# Patient Record
Sex: Female | Born: 1970 | Race: White | Hispanic: No | Marital: Married | Smoking: Never smoker
Health system: Southern US, Community
[De-identification: ages and names within clinical notes are randomized; demographics above are authoritative.]

## PROBLEM LIST (undated history)

## (undated) DIAGNOSIS — O09529 Supervision of elderly multigravida, unspecified trimester: Secondary | ICD-10-CM

## (undated) DIAGNOSIS — E039 Hypothyroidism, unspecified: Secondary | ICD-10-CM

## (undated) DIAGNOSIS — Z8619 Personal history of other infectious and parasitic diseases: Secondary | ICD-10-CM

## (undated) HISTORY — PX: WISDOM TOOTH EXTRACTION: SHX21

## (undated) HISTORY — DX: Supervision of elderly multigravida, unspecified trimester: O09.529

## (undated) HISTORY — PX: DILATION AND CURETTAGE OF UTERUS: SHX78

## (undated) HISTORY — PX: OTHER SURGICAL HISTORY: SHX169

## (undated) HISTORY — DX: Personal history of other infectious and parasitic diseases: Z86.19

## (undated) HISTORY — DX: Hypothyroidism, unspecified: E03.9

---

## 1994-07-22 HISTORY — PX: BUNIONECTOMY: SHX129

## 2009-04-03 ENCOUNTER — Encounter (HOSPITAL_COMMUNITY): Payer: Self-pay | Admitting: Obstetrics and Gynecology

## 2009-04-03 ENCOUNTER — Ambulatory Visit (HOSPITAL_COMMUNITY): Admission: RE | Admit: 2009-04-03 | Discharge: 2009-04-03 | Payer: Self-pay | Admitting: Obstetrics and Gynecology

## 2010-03-19 ENCOUNTER — Inpatient Hospital Stay (HOSPITAL_COMMUNITY): Admission: AD | Admit: 2010-03-19 | Discharge: 2010-03-22 | Payer: Self-pay | Admitting: Obstetrics and Gynecology

## 2010-10-05 LAB — CBC
HCT: 30.5 % — ABNORMAL LOW (ref 36.0–46.0)
HCT: 37.6 % (ref 36.0–46.0)
Hemoglobin: 10.5 g/dL — ABNORMAL LOW (ref 12.0–15.0)
Hemoglobin: 12.5 g/dL (ref 12.0–15.0)
MCH: 33.8 pg (ref 26.0–34.0)
MCH: 35.4 pg — ABNORMAL HIGH (ref 26.0–34.0)
MCHC: 33.3 g/dL (ref 30.0–36.0)
MCHC: 34.4 g/dL (ref 30.0–36.0)
MCV: 101.4 fL — ABNORMAL HIGH (ref 78.0–100.0)
MCV: 102.8 fL — ABNORMAL HIGH (ref 78.0–100.0)
Platelets: 138 10*3/uL — ABNORMAL LOW (ref 150–400)
Platelets: 181 10*3/uL (ref 150–400)
RBC: 2.97 MIL/uL — ABNORMAL LOW (ref 3.87–5.11)
RBC: 3.71 MIL/uL — ABNORMAL LOW (ref 3.87–5.11)
RDW: 14.2 % (ref 11.5–15.5)
RDW: 14.6 % (ref 11.5–15.5)
WBC: 15.7 10*3/uL — ABNORMAL HIGH (ref 4.0–10.5)
WBC: 16.9 10*3/uL — ABNORMAL HIGH (ref 4.0–10.5)

## 2010-10-05 LAB — RPR: RPR Ser Ql: NONREACTIVE

## 2010-10-26 LAB — CBC
HCT: 37.7 % (ref 36.0–46.0)
Hemoglobin: 12.7 g/dL (ref 12.0–15.0)
MCHC: 33.7 g/dL (ref 30.0–36.0)
MCV: 96.4 fL (ref 78.0–100.0)
Platelets: 234 10*3/uL (ref 150–400)
RBC: 3.91 MIL/uL (ref 3.87–5.11)
RDW: 13 % (ref 11.5–15.5)
WBC: 8 10*3/uL (ref 4.0–10.5)

## 2012-05-19 LAB — OB RESULTS CONSOLE ANTIBODY SCREEN: Antibody Screen: NEGATIVE

## 2012-05-19 LAB — OB RESULTS CONSOLE HIV ANTIBODY (ROUTINE TESTING): HIV: NONREACTIVE

## 2012-05-19 LAB — OB RESULTS CONSOLE RUBELLA ANTIBODY, IGM: Rubella: IMMUNE

## 2012-05-19 LAB — OB RESULTS CONSOLE ABO/RH: RH Type: POSITIVE

## 2012-05-19 LAB — OB RESULTS CONSOLE GC/CHLAMYDIA
Chlamydia: NEGATIVE
Gonorrhea: NEGATIVE

## 2012-05-19 LAB — OB RESULTS CONSOLE HEPATITIS B SURFACE ANTIGEN: Hepatitis B Surface Ag: NEGATIVE

## 2012-05-19 LAB — OB RESULTS CONSOLE RPR: RPR: NONREACTIVE

## 2012-12-21 ENCOUNTER — Inpatient Hospital Stay (HOSPITAL_COMMUNITY): Admission: AD | Admit: 2012-12-21 | Payer: Self-pay | Source: Ambulatory Visit | Admitting: Obstetrics and Gynecology

## 2012-12-22 ENCOUNTER — Telehealth (HOSPITAL_COMMUNITY): Payer: Self-pay | Admitting: *Deleted

## 2012-12-22 ENCOUNTER — Encounter (HOSPITAL_COMMUNITY): Payer: Self-pay | Admitting: *Deleted

## 2012-12-22 LAB — OB RESULTS CONSOLE GBS: GBS: NEGATIVE

## 2012-12-22 NOTE — Telephone Encounter (Signed)
Preadmission screen  

## 2012-12-24 ENCOUNTER — Encounter (HOSPITAL_COMMUNITY): Payer: Self-pay

## 2012-12-24 ENCOUNTER — Inpatient Hospital Stay (HOSPITAL_COMMUNITY)
Admission: RE | Admit: 2012-12-24 | Discharge: 2012-12-28 | DRG: 765 | Disposition: A | Discharge status: Home / Self Care | Payer: 59 | Source: Ambulatory Visit | Attending: Obstetrics & Gynecology | Admitting: Obstetrics & Gynecology

## 2012-12-24 DIAGNOSIS — O411090 Infection of amniotic sac and membranes, unspecified, unspecified trimester, not applicable or unspecified: Secondary | ICD-10-CM | POA: Diagnosis present

## 2012-12-24 DIAGNOSIS — E039 Hypothyroidism, unspecified: Secondary | ICD-10-CM | POA: Diagnosis present

## 2012-12-24 DIAGNOSIS — O480 Post-term pregnancy: Principal | ICD-10-CM | POA: Diagnosis present

## 2012-12-24 DIAGNOSIS — E079 Disorder of thyroid, unspecified: Secondary | ICD-10-CM | POA: Diagnosis present

## 2012-12-24 DIAGNOSIS — O09529 Supervision of elderly multigravida, unspecified trimester: Secondary | ICD-10-CM | POA: Diagnosis present

## 2012-12-24 DIAGNOSIS — O324XX0 Maternal care for high head at term, not applicable or unspecified: Secondary | ICD-10-CM | POA: Diagnosis present

## 2012-12-24 LAB — CBC
HCT: 33.3 % — ABNORMAL LOW (ref 36.0–46.0)
Hemoglobin: 11.3 g/dL — ABNORMAL LOW (ref 12.0–15.0)
MCH: 32 pg (ref 26.0–34.0)
MCHC: 33.9 g/dL (ref 30.0–36.0)
MCV: 94.3 fL (ref 78.0–100.0)
Platelets: 191 10*3/uL (ref 150–400)
RBC: 3.53 MIL/uL — ABNORMAL LOW (ref 3.87–5.11)
RDW: 14 % (ref 11.5–15.5)
WBC: 9.5 10*3/uL (ref 4.0–10.5)

## 2012-12-24 MED ORDER — LIDOCAINE HCL (PF) 1 % IJ SOLN
30.0000 mL | INTRAMUSCULAR | Status: DC | PRN
  Filled 2012-12-24: qty 30

## 2012-12-24 MED ORDER — ACETAMINOPHEN 325 MG PO TABS
650.0000 mg | ORAL_TABLET | ORAL | Status: DC | PRN
  Administered 2012-12-25: 650 mg via ORAL
  Filled 2012-12-24: qty 2

## 2012-12-24 MED ORDER — ZOLPIDEM TARTRATE 5 MG PO TABS
5.0000 mg | ORAL_TABLET | Freq: Every evening | ORAL | Status: DC | PRN
  Administered 2012-12-24: 5 mg via ORAL
  Filled 2012-12-24: qty 1

## 2012-12-24 MED ORDER — OXYCODONE-ACETAMINOPHEN 5-325 MG PO TABS: 1.0000 | ORAL_TABLET | ORAL | Status: DC | PRN

## 2012-12-24 MED ORDER — PROMETHAZINE HCL 25 MG/ML IJ SOLN: 25.0000 mg | Freq: Four times a day (QID) | INTRAMUSCULAR | Status: DC | PRN

## 2012-12-24 MED ORDER — OXYTOCIN BOLUS FROM INFUSION: 500.0000 mL | INTRAVENOUS | Status: DC

## 2012-12-24 MED ORDER — CITRIC ACID-SODIUM CITRATE 334-500 MG/5ML PO SOLN
30.0000 mL | ORAL | Status: DC | PRN
  Administered 2012-12-25: 30 mL via ORAL
  Filled 2012-12-24: qty 15

## 2012-12-24 MED ORDER — OXYTOCIN 40 UNITS IN LACTATED RINGERS INFUSION - SIMPLE MED: 62.5000 mL/h | INTRAVENOUS | Status: DC

## 2012-12-24 MED ORDER — BUTORPHANOL TARTRATE 1 MG/ML IJ SOLN: 1.0000 mg | INTRAMUSCULAR | Status: DC | PRN

## 2012-12-24 MED ORDER — TERBUTALINE SULFATE 1 MG/ML IJ SOLN
0.2500 mg | Freq: Once | INTRAMUSCULAR | Status: AC | PRN
  Filled 2012-12-24: qty 1

## 2012-12-24 MED ORDER — ONDANSETRON HCL 4 MG/2ML IJ SOLN: 4.0000 mg | Freq: Four times a day (QID) | INTRAMUSCULAR | Status: DC | PRN

## 2012-12-24 MED ORDER — FLEET ENEMA 7-19 GM/118ML RE ENEM: 1.0000 | ENEMA | RECTAL | Status: DC | PRN

## 2012-12-24 MED ORDER — LACTATED RINGERS IV SOLN
INTRAVENOUS | Status: DC
  Administered 2012-12-24 – 2012-12-25 (×3): via INTRAVENOUS

## 2012-12-24 MED ORDER — IBUPROFEN 600 MG PO TABS: 600.0000 mg | ORAL_TABLET | Freq: Four times a day (QID) | ORAL | Status: DC | PRN

## 2012-12-24 MED ORDER — LACTATED RINGERS IV SOLN
500.0000 mL | INTRAVENOUS | Status: DC | PRN
  Administered 2012-12-25 (×3): 300 mL via INTRAVENOUS

## 2012-12-24 MED ORDER — MISOPROSTOL 25 MCG QUARTER TABLET
25.0000 ug | ORAL_TABLET | ORAL | Status: DC | PRN
  Administered 2012-12-24 – 2012-12-25 (×2): 25 ug via VAGINAL
  Filled 2012-12-24 (×2): qty 0.25

## 2012-12-25 ENCOUNTER — Encounter (HOSPITAL_COMMUNITY)
Admission: RE | Disposition: A | Discharge status: Home / Self Care | Payer: Self-pay | Source: Ambulatory Visit | Attending: Obstetrics & Gynecology

## 2012-12-25 ENCOUNTER — Encounter (HOSPITAL_COMMUNITY): Payer: Self-pay

## 2012-12-25 ENCOUNTER — Inpatient Hospital Stay (HOSPITAL_COMMUNITY): Payer: 59 | Admitting: Anesthesiology

## 2012-12-25 ENCOUNTER — Encounter (HOSPITAL_COMMUNITY): Payer: Self-pay | Admitting: Anesthesiology

## 2012-12-25 LAB — RPR: RPR Ser Ql: NONREACTIVE

## 2012-12-25 SURGERY — Surgical Case
Anesthesia: Epidural | Site: Abdomen | Wound class: Clean Contaminated

## 2012-12-25 MED ORDER — MORPHINE SULFATE (PF) 0.5 MG/ML IJ SOLN
INTRAMUSCULAR | Status: DC | PRN
  Administered 2012-12-25: 4 mg via EPIDURAL

## 2012-12-25 MED ORDER — DIPHENHYDRAMINE HCL 25 MG PO CAPS: 25.0000 mg | ORAL_CAPSULE | Freq: Four times a day (QID) | ORAL | Status: DC | PRN

## 2012-12-25 MED ORDER — PHENYLEPHRINE 40 MCG/ML (10ML) SYRINGE FOR IV PUSH (FOR BLOOD PRESSURE SUPPORT)
80.0000 ug | PREFILLED_SYRINGE | INTRAVENOUS | Status: DC | PRN
  Administered 2012-12-25 (×2): 80 ug via INTRAVENOUS
  Filled 2012-12-25: qty 5

## 2012-12-25 MED ORDER — PRENATAL MULTIVITAMIN CH
1.0000 | ORAL_TABLET | Freq: Every day | ORAL | Status: DC
  Administered 2012-12-26 – 2012-12-28 (×3): 1 via ORAL
  Filled 2012-12-25 (×3): qty 1

## 2012-12-25 MED ORDER — MEDROXYPROGESTERONE ACETATE 150 MG/ML IM SUSP: 150.0000 mg | INTRAMUSCULAR | Status: DC | PRN

## 2012-12-25 MED ORDER — NALBUPHINE HCL 10 MG/ML IJ SOLN: 5.0000 mg | INTRAMUSCULAR | Status: DC | PRN

## 2012-12-25 MED ORDER — SODIUM CHLORIDE 0.9 % IV SOLN
2.0000 g | Freq: Once | INTRAVENOUS | Status: AC
  Administered 2012-12-25: 2 g via INTRAVENOUS
  Filled 2012-12-25: qty 2000

## 2012-12-25 MED ORDER — KETOROLAC TROMETHAMINE 30 MG/ML IJ SOLN: 30.0000 mg | Freq: Four times a day (QID) | INTRAMUSCULAR | Status: AC | PRN

## 2012-12-25 MED ORDER — MEPERIDINE HCL 25 MG/ML IJ SOLN
INTRAMUSCULAR | Status: DC | PRN
  Administered 2012-12-25 (×2): 12.5 mg via INTRAVENOUS

## 2012-12-25 MED ORDER — MORPHINE SULFATE 0.5 MG/ML IJ SOLN
INTRAMUSCULAR | Status: AC
  Filled 2012-12-25: qty 10

## 2012-12-25 MED ORDER — SODIUM CHLORIDE 0.9 % IR SOLN
Status: DC | PRN
  Administered 2012-12-25: 1000 mL

## 2012-12-25 MED ORDER — DIBUCAINE 1 % RE OINT: 1.0000 "application " | TOPICAL_OINTMENT | RECTAL | Status: DC | PRN

## 2012-12-25 MED ORDER — DIPHENHYDRAMINE HCL 50 MG/ML IJ SOLN: 25.0000 mg | INTRAMUSCULAR | Status: DC | PRN

## 2012-12-25 MED ORDER — LACTATED RINGERS IV SOLN
INTRAVENOUS | Status: DC | PRN
  Administered 2012-12-25: 19:00:00 via INTRAVENOUS

## 2012-12-25 MED ORDER — LIDOCAINE HCL (PF) 1 % IJ SOLN
INTRAMUSCULAR | Status: DC | PRN
  Administered 2012-12-25 (×2): 5 mL

## 2012-12-25 MED ORDER — TETANUS-DIPHTH-ACELL PERTUSSIS 5-2.5-18.5 LF-MCG/0.5 IM SUSP: 0.5000 mL | Freq: Once | INTRAMUSCULAR | Status: DC

## 2012-12-25 MED ORDER — FENTANYL CITRATE 0.05 MG/ML IJ SOLN
25.0000 ug | INTRAMUSCULAR | Status: DC | PRN
  Administered 2012-12-25: 50 ug via INTRAVENOUS

## 2012-12-25 MED ORDER — LACTATED RINGERS IV SOLN
INTRAVENOUS | Status: DC | PRN
  Administered 2012-12-25 (×2): via INTRAVENOUS

## 2012-12-25 MED ORDER — EPHEDRINE 5 MG/ML INJ
10.0000 mg | INTRAVENOUS | Status: DC | PRN
  Filled 2012-12-25: qty 4

## 2012-12-25 MED ORDER — SODIUM BICARBONATE 8.4 % IV SOLN: INTRAVENOUS | Status: DC | PRN

## 2012-12-25 MED ORDER — ONDANSETRON HCL 4 MG PO TABS: 4.0000 mg | ORAL_TABLET | ORAL | Status: DC | PRN

## 2012-12-25 MED ORDER — GENTAMICIN SULFATE 40 MG/ML IJ SOLN
150.0000 mg | Freq: Three times a day (TID) | INTRAVENOUS | Status: DC
  Administered 2012-12-25: 150 mg via INTRAVENOUS
  Filled 2012-12-25 (×2): qty 3.75

## 2012-12-25 MED ORDER — LIDOCAINE-EPINEPHRINE (PF) 2 %-1:200000 IJ SOLN
INTRAMUSCULAR | Status: DC | PRN
  Administered 2012-12-25 (×4): 5 mg via INTRADERMAL

## 2012-12-25 MED ORDER — MEPERIDINE HCL 25 MG/ML IJ SOLN
INTRAMUSCULAR | Status: AC
  Filled 2012-12-25: qty 1

## 2012-12-25 MED ORDER — KETOROLAC TROMETHAMINE 60 MG/2ML IM SOLN
60.0000 mg | Freq: Once | INTRAMUSCULAR | Status: AC | PRN
  Administered 2012-12-25: 60 mg via INTRAMUSCULAR

## 2012-12-25 MED ORDER — METOCLOPRAMIDE HCL 5 MG/ML IJ SOLN: 10.0000 mg | Freq: Three times a day (TID) | INTRAMUSCULAR | Status: DC | PRN

## 2012-12-25 MED ORDER — ONDANSETRON HCL 4 MG/2ML IJ SOLN: 4.0000 mg | INTRAMUSCULAR | Status: DC | PRN

## 2012-12-25 MED ORDER — SCOPOLAMINE 1 MG/3DAYS TD PT72
MEDICATED_PATCH | TRANSDERMAL | Status: AC
  Filled 2012-12-25: qty 1

## 2012-12-25 MED ORDER — SIMETHICONE 80 MG PO CHEW
80.0000 mg | CHEWABLE_TABLET | Freq: Three times a day (TID) | ORAL | Status: DC
  Administered 2012-12-26 – 2012-12-28 (×9): 80 mg via ORAL

## 2012-12-25 MED ORDER — FENTANYL 2.5 MCG/ML BUPIVACAINE 1/10 % EPIDURAL INFUSION (WH - ANES)
14.0000 mL/h | INTRAMUSCULAR | Status: DC | PRN
  Administered 2012-12-25 (×2): 14 mL/h via EPIDURAL
  Filled 2012-12-25 (×2): qty 125

## 2012-12-25 MED ORDER — PHENYLEPHRINE 40 MCG/ML (10ML) SYRINGE FOR IV PUSH (FOR BLOOD PRESSURE SUPPORT): 80.0000 ug | PREFILLED_SYRINGE | INTRAVENOUS | Status: DC | PRN

## 2012-12-25 MED ORDER — ONDANSETRON HCL 4 MG/2ML IJ SOLN
INTRAMUSCULAR | Status: DC | PRN
  Administered 2012-12-25: 4 mg via INTRAVENOUS

## 2012-12-25 MED ORDER — OXYTOCIN 40 UNITS IN LACTATED RINGERS INFUSION - SIMPLE MED: 62.5000 mL/h | INTRAVENOUS | Status: AC

## 2012-12-25 MED ORDER — MORPHINE SULFATE (PF) 0.5 MG/ML IJ SOLN
INTRAMUSCULAR | Status: DC | PRN
  Administered 2012-12-25: 1 mg via INTRAVENOUS

## 2012-12-25 MED ORDER — SODIUM CHLORIDE 0.9 % IJ SOLN: 3.0000 mL | INTRAMUSCULAR | Status: DC | PRN

## 2012-12-25 MED ORDER — SENNOSIDES-DOCUSATE SODIUM 8.6-50 MG PO TABS
2.0000 | ORAL_TABLET | Freq: Every day | ORAL | Status: DC
  Administered 2012-12-26 – 2012-12-27 (×2): 2 via ORAL

## 2012-12-25 MED ORDER — NALOXONE HCL 1 MG/ML IJ SOLN: 1.0000 ug/kg/h | INTRAVENOUS | Status: DC | PRN

## 2012-12-25 MED ORDER — MENTHOL 3 MG MT LOZG: 1.0000 | LOZENGE | OROMUCOSAL | Status: DC | PRN

## 2012-12-25 MED ORDER — DEXTROSE IN LACTATED RINGERS 5 % IV SOLN
INTRAVENOUS | Status: DC
  Administered 2012-12-26: 04:00:00 via INTRAVENOUS

## 2012-12-25 MED ORDER — MEPERIDINE HCL 25 MG/ML IJ SOLN: 6.2500 mg | INTRAMUSCULAR | Status: DC | PRN

## 2012-12-25 MED ORDER — DIPHENHYDRAMINE HCL 50 MG/ML IJ SOLN: 12.5000 mg | INTRAMUSCULAR | Status: DC | PRN

## 2012-12-25 MED ORDER — KETOROLAC TROMETHAMINE 60 MG/2ML IM SOLN
INTRAMUSCULAR | Status: AC
  Filled 2012-12-25: qty 2

## 2012-12-25 MED ORDER — ONDANSETRON HCL 4 MG/2ML IJ SOLN
4.0000 mg | Freq: Three times a day (TID) | INTRAMUSCULAR | Status: DC | PRN
Start: 2012-12-25 — End: 2012-12-28

## 2012-12-25 MED ORDER — LACTATED RINGERS IV SOLN
500.0000 mL | Freq: Once | INTRAVENOUS | Status: AC
  Administered 2012-12-25: 500 mL via INTRAVENOUS

## 2012-12-25 MED ORDER — SCOPOLAMINE 1 MG/3DAYS TD PT72
1.0000 | MEDICATED_PATCH | Freq: Once | TRANSDERMAL | Status: DC
  Administered 2012-12-25: 1.5 mg via TRANSDERMAL

## 2012-12-25 MED ORDER — MEASLES, MUMPS & RUBELLA VAC ~~LOC~~ INJ: 0.5000 mL | INJECTION | Freq: Once | SUBCUTANEOUS | Status: DC

## 2012-12-25 MED ORDER — IBUPROFEN 600 MG PO TABS
600.0000 mg | ORAL_TABLET | Freq: Four times a day (QID) | ORAL | Status: DC
  Administered 2012-12-26 – 2012-12-28 (×10): 600 mg via ORAL
  Filled 2012-12-25 (×10): qty 1

## 2012-12-25 MED ORDER — OXYCODONE-ACETAMINOPHEN 5-325 MG PO TABS
1.0000 | ORAL_TABLET | ORAL | Status: DC | PRN
  Administered 2012-12-26 – 2012-12-28 (×8): 1 via ORAL
  Filled 2012-12-25 (×8): qty 1

## 2012-12-25 MED ORDER — OXYTOCIN 40 UNITS IN LACTATED RINGERS INFUSION - SIMPLE MED
1.0000 m[IU]/min | INTRAVENOUS | Status: DC
  Administered 2012-12-25: 2 m[IU]/min via INTRAVENOUS
  Filled 2012-12-25: qty 1000

## 2012-12-25 MED ORDER — WITCH HAZEL-GLYCERIN EX PADS: 1.0000 "application " | MEDICATED_PAD | CUTANEOUS | Status: DC | PRN

## 2012-12-25 MED ORDER — SIMETHICONE 80 MG PO CHEW
80.0000 mg | CHEWABLE_TABLET | ORAL | Status: DC | PRN
  Administered 2012-12-26: 80 mg via ORAL

## 2012-12-25 MED ORDER — TERBUTALINE SULFATE 1 MG/ML IJ SOLN: 0.2500 mg | Freq: Once | INTRAMUSCULAR | Status: DC | PRN

## 2012-12-25 MED ORDER — FENTANYL CITRATE 0.05 MG/ML IJ SOLN
INTRAMUSCULAR | Status: AC
  Filled 2012-12-25: qty 2

## 2012-12-25 MED ORDER — LANOLIN HYDROUS EX OINT: 1.0000 "application " | TOPICAL_OINTMENT | CUTANEOUS | Status: DC | PRN

## 2012-12-25 MED ORDER — NALOXONE HCL 0.4 MG/ML IJ SOLN: 0.4000 mg | INTRAMUSCULAR | Status: DC | PRN

## 2012-12-25 MED ORDER — LEVOTHYROXINE SODIUM 88 MCG PO TABS
88.0000 ug | ORAL_TABLET | Freq: Every day | ORAL | Status: DC
  Administered 2012-12-25: 88 ug via ORAL
  Filled 2012-12-25 (×2): qty 1

## 2012-12-25 MED ORDER — DIPHENHYDRAMINE HCL 25 MG PO CAPS: 25.0000 mg | ORAL_CAPSULE | ORAL | Status: DC | PRN

## 2012-12-25 MED ORDER — EPHEDRINE 5 MG/ML INJ: 10.0000 mg | INTRAVENOUS | Status: DC | PRN

## 2012-12-25 SURGICAL SUPPLY — 30 items
CLAMP CORD UMBIL (MISCELLANEOUS) IMPLANT
CLOTH BEACON ORANGE TIMEOUT ST (SAFETY) ×2 IMPLANT
DERMABOND ADVANCED (GAUZE/BANDAGES/DRESSINGS) ×1
DERMABOND ADVANCED .7 DNX12 (GAUZE/BANDAGES/DRESSINGS) ×1 IMPLANT
DRAPE LG THREE QUARTER DISP (DRAPES) ×2 IMPLANT
DRSG OPSITE POSTOP 4X10 (GAUZE/BANDAGES/DRESSINGS) ×2 IMPLANT
DURAPREP 26ML APPLICATOR (WOUND CARE) ×2 IMPLANT
ELECT REM PT RETURN 9FT ADLT (ELECTROSURGICAL) ×2
ELECTRODE REM PT RTRN 9FT ADLT (ELECTROSURGICAL) ×1 IMPLANT
EXTRACTOR VACUUM M CUP 4 TUBE (SUCTIONS) IMPLANT
GLOVE BIO SURGEON STRL SZ 6.5 (GLOVE) ×2 IMPLANT
GLOVE BIOGEL PI IND STRL 7.0 (GLOVE) ×1 IMPLANT
GLOVE BIOGEL PI INDICATOR 7.0 (GLOVE) ×1
GOWN STRL REIN XL XLG (GOWN DISPOSABLE) ×4 IMPLANT
KIT ABG SYR 3ML LUER SLIP (SYRINGE) ×2 IMPLANT
NEEDLE HYPO 25X5/8 SAFETYGLIDE (NEEDLE) ×2 IMPLANT
NS IRRIG 1000ML POUR BTL (IV SOLUTION) ×2 IMPLANT
PACK C SECTION WH (CUSTOM PROCEDURE TRAY) ×2 IMPLANT
PAD OB MATERNITY 4.3X12.25 (PERSONAL CARE ITEMS) ×2 IMPLANT
SPONGE LAP 18X18 X RAY DECT (DISPOSABLE) ×2 IMPLANT
STAPLER VISISTAT 35W (STAPLE) IMPLANT
SUT CHROMIC 0 CT 802H (SUTURE) IMPLANT
SUT CHROMIC 0 CTX 36 (SUTURE) ×4 IMPLANT
SUT MON AB-0 CT1 36 (SUTURE) ×2 IMPLANT
SUT PDS AB 0 CTX 60 (SUTURE) ×2 IMPLANT
SUT PLAIN 0 NONE (SUTURE) IMPLANT
SUT VIC AB 4-0 KS 27 (SUTURE) ×2 IMPLANT
TOWEL OR 17X24 6PK STRL BLUE (TOWEL DISPOSABLE) ×6 IMPLANT
TRAY FOLEY CATH 14FR (SET/KITS/TRAYS/PACK) IMPLANT
WATER STERILE IRR 1000ML POUR (IV SOLUTION) ×2 IMPLANT

## 2012-12-25 NOTE — Progress Notes (Signed)
Pt comfortable w/ epidural  FHT 130-140s with decreased variability Toco Q2 cvx 9.5/c/+1, + scalp stim.  Feels warm Temp 101  A/P:  chorio - start amp/gent Recheck or prn

## 2012-12-25 NOTE — Anesthesia Procedure Notes (Signed)
Epidural Patient location during procedure: OB Start time: 12/25/2012 9:33 AM  Staffing Anesthesiologist: Angus Seller., Harrell Gave. Performed by: anesthesiologist   Preanesthetic Checklist Completed: patient identified, site marked, surgical consent, pre-op evaluation, timeout performed, IV checked, risks and benefits discussed and monitors and equipment checked  Epidural Patient position: sitting Prep: site prepped and draped and DuraPrep Patient monitoring: continuous pulse ox and blood pressure Approach: midline Injection technique: LOR air and LOR saline  Needle:  Needle type: Tuohy  Needle gauge: 17 G Needle length: 9 cm and 9 Needle insertion depth: 5 cm cm Catheter type: closed end flexible Catheter size: 19 Gauge Catheter at skin depth: 10 cm Test dose: negative  Assessment Events: blood not aspirated, injection not painful, no injection resistance, negative IV test and no paresthesia  Additional Notes Patient identified.  Risk benefits discussed including failed block, incomplete pain control, headache, nerve damage, paralysis, blood pressure changes, nausea, vomiting, reactions to medication both toxic or allergic, and postpartum back pain.  Patient expressed understanding and wished to proceed.  All questions were answered.  Sterile technique used throughout procedure and epidural site dressed with sterile barrier dressing. No paresthesia or other complications noted.The patient did not experience any signs of intravascular injection such as tinnitus or metallic taste in mouth nor signs of intrathecal spread such as rapid motor block. Please see nursing notes for vital signs.

## 2012-12-25 NOTE — Progress Notes (Signed)
ANTIBIOTIC CONSULT NOTE - INITIAL  Pharmacy Consult for Gentamicin Indication: Maternal temp/ R/O chorioamnionitis  No Known Allergies  Patient Measurements: Height: 5\' 3"  (160 cm) Weight: 176 lb (79.833 kg) IBW/kg (Calculated) : 52.4 Adjusted Body Weight: 60.6kg  Vital Signs: Temp: 101.4 F (38.6 C) (06/06 1813) Temp src: Axillary (06/06 1813) BP: 130/75 mmHg (06/06 1831) Pulse Rate: 63 (06/06 1831)  Labs:  Recent Labs  12/24/12 1808  WBC 9.5  HGB 11.3*  PLT 191     Microbiology: Recent Results (from the past 720 hour(s))  OB RESULTS CONSOLE GBS     Status: None   Collection Time    12/22/12 12:00 AM      Result Value Range Status   GBS Negative   Final    Medical History: Past Medical History  Diagnosis Date  . Hx of varicella   . Hypothyroidism   . AMA (advanced maternal age) multigravida 35+     Medications: Ampicillin 2 gram IV q6h Assessment: 42yo F  Admitted for IOL postdate. Pt has developed maternal temp in labor. Antibiotics initiated to r/o chorioamnionitis.  Goal of Therapy:  Gentamicin peaks 6-55mcg/ml and trough < 26mcg/ml  Plan:  1. Gentamicin 150mg  IV q8h. 2. Will check SCr if Gentamicin continued > 72 hours. 3. Will continue to follow and assess need for levels based on duration and clinical status of pt.  Claybon Jabs 12/25/2012,8:11 PM

## 2012-12-25 NOTE — H&P (Signed)
Deborah Bowman is a 42 y.o. female presenting for postdates IOL.  Pregnancy uncomplicated History OB History   Grav Para Term Preterm Abortions TAB SAB Ect Mult Living   4 1 1  2  2   1      Past Medical History  Diagnosis Date  . Hx of varicella   . Hypothyroidism   . AMA (advanced maternal age) multigravida 35+    Past Surgical History  Procedure Laterality Date  . Paraovarian cyst removal      and tube ? side  . Dilation and curettage of uterus    . Bunionectomy  1996  . Wisdom tooth extraction     Family History: family history includes Depression in her paternal grandmother; Heart disease in her maternal grandfather and maternal grandmother; Hypertension in her maternal grandmother; and Hypothyroidism in her father. Social History:  reports that she has never smoked. She has never used smokeless tobacco. She reports that she does not drink alcohol or use illicit drugs.   Prenatal Transfer Tool  Maternal Diabetes: No Genetic Screening: Normal Maternal Ultrasounds/Referrals: Normal Fetal Ultrasounds or other Referrals:  None Maternal Substance Abuse:  No Significant Maternal Medications:  Meds include: Syntroid Significant Maternal Lab Results:  None Other Comments:  None  ROS  Dilation: 1.5 Effacement (%): Thick Station: -3 Exam by:: Renaldo Fiddler, MD Blood pressure 108/56, pulse 57, temperature 98.6 F (37 C), temperature source Oral, resp. rate 18, height 5\' 3"  (1.6 m), weight 79.833 kg (176 lb), last menstrual period 03/16/2012. Exam Physical Exam  Prenatal labs: ABO, Rh: O/Positive/-- (10/29 0000) Antibody: Negative (10/29 0000) Rubella: Immune (10/29 0000) RPR: NON REACTIVE (06/05 1808)  HBsAg: Negative (10/29 0000)  HIV: Non-reactive (10/29 0000)  GBS: Negative (06/03 0000)   Assessment/Plan: Will start pitocin Epidural prn Continue synthroid  Deborah Bowman 12/25/2012, 7:56 AM

## 2012-12-25 NOTE — Op Note (Signed)
Cesarean Section Procedure Note   Deborah Bowman  12/24/2012 - 12/25/2012  Indications: arrest of descent   Pre-operative Diagnosis: Arrest of Dilatation.   Post-operative Diagnosis: Same   Surgeon: Surgeon(s) and Role:    * Zelphia Cairo, MD - Primary   Assistants: none  Anesthesia: epidural   Procedure Details:  The patient was seen in the Holding Room. The risks, benefits, complications, treatment options, and expected outcomes were discussed with the patient. The patient concurred with the proposed plan, giving informed consent. identified as Deborah Bowman and the procedure verified as C-Section Delivery. A Time Out was held and the above information confirmed.  After induction of anesthesia, the patient was draped and prepped in the usual sterile manner. A transverse was made and carried down through the subcutaneous tissue to the fascia. Fascial incision was made and extended transversely. The fascia was separated from the underlying rectus tissue superiorly and inferiorly. The peritoneum was identified and entered. Peritoneal incision was extended longitudinally. The utero-vesical peritoneal reflection was incised transversely and the bladder flap was bluntly freed from the lower uterine segment. A low transverse uterine incision was made. Delivered from cephalic presentation was a vigerous female with Apgar scores of 8 at one minute and 9 at five minutes. Cord ph was not sent the umbilical cord was clamped and cut cord blood was obtained for evaluation. The placenta was removed Intact and appeared normal. The uterine outline, tubes and ovaries appeared normal}. The uterine incision was closed with running locked sutures of 0chromic gut.   Hemostasis was observed. Lavage was carried out until clear.  Peritoneum was closed with 0 monocryl. The fascia was then reapproximated with running sutures of 0PDS.  The skin was closed with 4-0Vicryl.   Instrument, sponge, and needle counts were  correct prior the abdominal closure and were correct at the conclusion of the case.     Estimated Blood Loss: 700cc   Urine Output: clear  Specimens: @ORSPECIMEN @   Complications: no complications  Disposition: PACU - hemodynamically stable.   Maternal Condition: stable   Baby condition / location:  stable with mom  Attending Attestation: I was present and scrubbed for the entire procedure.   Signed: Surgeon(s): Zelphia Cairo, MD

## 2012-12-25 NOTE — Progress Notes (Signed)
Pt comfortable.  Called to room w/ FHR decel x Good response to pitocin off, position change & O2  FHT reassuring Toco Q2 Cvx 2/th/-2 FSE placed  A/P:  Will d/c pitocin, only restart if ctx decrease Watch FHT closely

## 2012-12-25 NOTE — Anesthesia Postprocedure Evaluation (Signed)
  Anesthesia Post-op Note  Patient: Deborah Bowman  Procedure(s) Performed: Procedure(s) with comments: CESAREAN SECTION (N/A) - Primary Cesarean Section Delivery Baby Boy @ [phone], Apgars 8/9  Patient Location: PACU  Anesthesia Type:Epidural  Level of Consciousness: awake, alert  and oriented  Airway and Oxygen Therapy: Patient Spontanous Breathing  Post-op Pain: none  Post-op Assessment: Post-op Vital signs reviewed, Patient's Cardiovascular Status Stable, Respiratory Function Stable, Patent Airway, No signs of Nausea or vomiting, Pain level controlled, No headache and No backache  Post-op Vital Signs: Reviewed and stable  Complications: No apparent anesthesia complications

## 2012-12-25 NOTE — Anesthesia Preprocedure Evaluation (Signed)
Anesthesia Evaluation  Patient identified by MRN, date of birth, ID band Patient awake    Reviewed: Allergy & Precautions, H&P , Patient's Chart, lab work & pertinent test results  Airway Mallampati: II TM Distance: >3 FB Neck ROM: full    Dental no notable dental hx.    Pulmonary neg pulmonary ROS,  breath sounds clear to auscultation  Pulmonary exam normal       Cardiovascular negative cardio ROS  Rhythm:regular Rate:Normal     Neuro/Psych negative neurological ROS  negative psych ROS   GI/Hepatic negative GI ROS, Neg liver ROS,   Endo/Other  negative endocrine ROSHypothyroidism   Renal/GU negative Renal ROS     Musculoskeletal   Abdominal   Peds  Hematology negative hematology ROS (+)   Anesthesia Other Findings   Reproductive/Obstetrics (+) Pregnancy                           Anesthesia Physical Anesthesia Plan  ASA: II  Anesthesia Plan: Epidural   Post-op Pain Management:    Induction:   Airway Management Planned:   Additional Equipment:   Intra-op Plan:   Post-operative Plan:   Informed Consent: I have reviewed the patients History and Physical, chart, labs and discussed the procedure including the risks, benefits and alternatives for the proposed anesthesia with the patient or authorized representative who has indicated his/her understanding and acceptance.     Plan Discussed with:   Anesthesia Plan Comments:         Anesthesia Quick Evaluation

## 2012-12-25 NOTE — Progress Notes (Signed)
Called to room bc of decel.  Good recovery w/ position change and pitocin off.  FHT 130s now w/ good variability toco Q2-3 Cvx 9.5/+1 station  A/P:  Keep pitocin off, recheck in 10-15min

## 2012-12-25 NOTE — Transfer of Care (Signed)
Immediate Anesthesia Transfer of Care Note  Patient: Deborah Bowman  Procedure(s) Performed: Procedure(s) with comments: CESAREAN SECTION (N/A) - Primary Cesarean Section Delivery Baby Boy @ [phone], Apgars 8/9  Patient Location: PACU  Anesthesia Type:Epidural  Level of Consciousness: awake, alert  and oriented  Airway & Oxygen Therapy: Patient Spontanous Breathing  Post-op Assessment: Report given to PACU RN and Post -op Vital signs reviewed and stable  Post vital signs: Reviewed and stable  Complications: No apparent anesthesia complications

## 2012-12-25 NOTE — Progress Notes (Signed)
Renaldo Fiddler, MD, at beside. Patient informed of the risks and benefits of a cesarean section. Patient verbalizes understanding. Consents signed.

## 2012-12-25 NOTE — Progress Notes (Addendum)
Pt feeling increased pressure  FHT good variablility with early decels.  occ late decel.   Toco Q3 Cvx 9/c/0  Good scalp stim  A/P:  Exp mngt

## 2012-12-25 NOTE — Progress Notes (Signed)
Pt comfortable  FHT 150-160s with decreased variability.  occ spontaneous acel Toco Q2-3 Cvx 9.5/C/0-edematous with mild caput  A/P:  No change in cervix.  Do not feel comfortable restarting pitocin after 2 episodes of hyperstimulation on . Pt and husband agree with plan of care. Rec primary c-section.  R/b/a discussed, informed consent

## 2012-12-26 LAB — CBC
HCT: 26.6 % — ABNORMAL LOW (ref 36.0–46.0)
Hemoglobin: 8.9 g/dL — ABNORMAL LOW (ref 12.0–15.0)
MCH: 32.1 pg (ref 26.0–34.0)
MCHC: 33.8 g/dL (ref 30.0–36.0)
MCV: 95 fL (ref 78.0–100.0)
Platelets: 130 10*3/uL — ABNORMAL LOW (ref 150–400)
RBC: 2.8 MIL/uL — ABNORMAL LOW (ref 3.87–5.11)
RDW: 14 % (ref 11.5–15.5)
WBC: 25.2 10*3/uL — ABNORMAL HIGH (ref 4.0–10.5)

## 2012-12-26 MED ORDER — LEVOTHYROXINE SODIUM 88 MCG PO TABS
88.0000 ug | ORAL_TABLET | Freq: Every day | ORAL | Status: DC
  Administered 2012-12-26 – 2012-12-28 (×3): 88 ug via ORAL
  Filled 2012-12-26 (×5): qty 1

## 2012-12-26 NOTE — Anesthesia Postprocedure Evaluation (Signed)
  Anesthesia Post-op Note  Patient: Deborah Bowman  Procedure(s) Performed: Procedure(s) with comments: CESAREAN SECTION (N/A) - Primary Cesarean Section Delivery Baby Boy @ [phone], Apgars 8/9  Patient Location: Mother/Baby  Anesthesia Type:Epidural  Level of Consciousness: awake and alert   Airway and Oxygen Therapy: Patient Spontanous Breathing  Post-op Pain: mild  Post-op Assessment: Patient's Cardiovascular Status Stable, Respiratory Function Stable, No signs of Nausea or vomiting, Pain level controlled and No headache  Post-op Vital Signs: Reviewed and stable  Complications: No apparent anesthesia complications

## 2012-12-26 NOTE — Progress Notes (Signed)
Hr 46-54  o2  Sat 92-95   Up to bathroom/no problems/no symptons/ dr foster aware. Will continue to observe

## 2012-12-27 NOTE — Progress Notes (Signed)
Subjective: Postpartum Day 2: Cesarean Delivery Patient reports incisional pain, tolerating PO, + flatus and no problems voiding.    Objective: Vital signs in last 24 hours: Temp:  [97.7 F (36.5 C)-98.2 F (36.8 C)] 98 F (36.7 C) (06/08 0643) Pulse Rate:  [51-61] 58 (06/08 0643) Resp:  [18] 18 (06/08 0643) BP: (91-120)/(51-67) 120/67 mmHg (06/08 0643) SpO2:  [94 %-96 %] 94 % (06/07 2044)  Physical Exam:  General: alert and cooperative Lochia: appropriate Uterine Fundus: firm Incision: healing well DVT Evaluation: No evidence of DVT seen on physical exam.   Recent Labs  12/24/12 1808 12/26/12 0610  HGB 11.3* 8.9*  HCT 33.3* 26.6*    Assessment/Plan: Status post Cesarean section. Doing well postoperatively.  Continue current care.  Deborah Bowman 12/27/2012, 8:33 AM

## 2012-12-27 NOTE — Lactation Note (Signed)
This note was copied from the chart of Boy Birdie Fetty. Lactation Consultation Note  Patient Name: Boy Jazmynn Pho [id number] Date: 12/27/2012 Reason for consult: Initial assessment   Maternal Data Formula Feeding for Exclusion: No Infant to breast within first hour of birth: Yes Does the patient have breastfeeding experience prior to this delivery?: Yes  Feeding   LATCH Score/Interventions   Comfort (Breast/Nipple): Filling, red/small blisters or bruises, mild/mod discomfort  Problem noted: Mild/Moderate discomfort Interventions (Mild/moderate discomfort): Comfort gels  Lactation Tools Discussed/Used     Consult Status Consult Status: Follow-up Date: 12/28/12 Follow-up type: In-patient  Mom reports that baby has been latched well but nipples are getting sore. Slightly pink and raw area noted on upper outer tips. Reviewed signs of a good latch- wide open mouth and having the baby deep onto the breast. Suggested using football position occasionally to change baby's position- has been using cradle hold every feeding. Baby was circ'd this morning and is very sleepy now. Encouraged to page for assist when baby showing feeding cues. Comfort gels given with instructions for use and cleaning.Placed them on nipples and mom reports they feel great.  Asking about pump pieces for her sister's pump. Encouraged to call insurance company to see whet they will cover. No further questions at present. BF brochure given with resources for support after DC.  Pamelia Hoit 12/27/2012, 11:39 AM

## 2012-12-28 ENCOUNTER — Encounter (HOSPITAL_COMMUNITY): Payer: Self-pay | Admitting: Obstetrics and Gynecology

## 2012-12-28 MED ORDER — OXYCODONE-ACETAMINOPHEN 5-325 MG PO TABS: 1.0000 | ORAL_TABLET | ORAL | Status: DC | PRN

## 2012-12-28 MED ORDER — IBUPROFEN 600 MG PO TABS: 600.0000 mg | ORAL_TABLET | Freq: Four times a day (QID) | ORAL | Status: DC

## 2012-12-28 NOTE — Discharge Summary (Signed)
Obstetric Discharge Summary Reason for Admission: induction of labor Prenatal Procedures: ultrasound Intrapartum Procedures: cesarean: low cervical, transverse Postpartum Procedures: none Complications-Operative and Postpartum: none Hemoglobin  Date Value Range Status  12/26/2012 8.9* 12.0 - 15.0 g/dL Final     REPEATED TO VERIFY     DELTA CHECK NOTED     HCT  Date Value Range Status  12/26/2012 26.6* 36.0 - 46.0 % Final    Physical Exam:  General: alert and cooperative Lochia: appropriate Uterine Fundus: firm Incision: honeycomb dressing CDI DVT Evaluation: No evidence of DVT seen on physical exam. Negative Homan's sign. No cords or calf tenderness. No significant calf/ankle edema.  Discharge Diagnoses: Term Pregnancy-delivered  Discharge Information: Date: 12/28/2012 Activity: pelvic rest Diet: routine Medications: PNV, Ibuprofen, Percocet and synthroid Condition: stable Instructions: refer to practice specific booklet Discharge to: home   Newborn Data: Live born female  Birth Weight: 8 lb 3.9 oz (3739 g) APGAR: 8, 9  Home with mother.  Ignacio Lowder G 12/28/2012, 8:09 AM

## 2014-03-01 ENCOUNTER — Other Ambulatory Visit: Payer: Self-pay | Admitting: Obstetrics and Gynecology

## 2014-03-02 LAB — CYTOLOGY - PAP

## 2014-05-04 LAB — OB RESULTS CONSOLE HEPATITIS B SURFACE ANTIGEN: Hepatitis B Surface Ag: NEGATIVE

## 2014-05-04 LAB — OB RESULTS CONSOLE HIV ANTIBODY (ROUTINE TESTING): HIV: NONREACTIVE

## 2014-05-04 LAB — OB RESULTS CONSOLE RUBELLA ANTIBODY, IGM: Rubella: IMMUNE

## 2014-05-04 LAB — OB RESULTS CONSOLE ABO/RH: RH Type: POSITIVE

## 2014-05-04 LAB — OB RESULTS CONSOLE GC/CHLAMYDIA
Chlamydia: NEGATIVE
Gonorrhea: NEGATIVE

## 2014-05-04 LAB — OB RESULTS CONSOLE RPR: RPR: NONREACTIVE

## 2014-05-04 LAB — OB RESULTS CONSOLE ANTIBODY SCREEN: Antibody Screen: NEGATIVE

## 2014-05-23 ENCOUNTER — Encounter (HOSPITAL_COMMUNITY): Payer: Self-pay | Admitting: Obstetrics and Gynecology

## 2014-12-09 ENCOUNTER — Encounter (HOSPITAL_COMMUNITY): Payer: Self-pay

## 2014-12-12 ENCOUNTER — Encounter (HOSPITAL_COMMUNITY)
Admission: RE | Admit: 2014-12-12 | Discharge: 2014-12-12 | Disposition: A | Discharge status: Home / Self Care | Payer: 59 | Source: Ambulatory Visit | Attending: Obstetrics and Gynecology | Admitting: Obstetrics and Gynecology

## 2014-12-12 ENCOUNTER — Encounter (HOSPITAL_COMMUNITY): Payer: Self-pay

## 2014-12-12 LAB — TYPE AND SCREEN
ABO/RH(D): O POS
Antibody Screen: NEGATIVE

## 2014-12-12 LAB — CBC
HCT: 33.6 % — ABNORMAL LOW (ref 36.0–46.0)
Hemoglobin: 11.2 g/dL — ABNORMAL LOW (ref 12.0–15.0)
MCH: 31.9 pg (ref 26.0–34.0)
MCHC: 33.3 g/dL (ref 30.0–36.0)
MCV: 95.7 fL (ref 78.0–100.0)
Platelets: 168 10*3/uL (ref 150–400)
RBC: 3.51 MIL/uL — ABNORMAL LOW (ref 3.87–5.11)
RDW: 14.1 % (ref 11.5–15.5)
WBC: 10.4 10*3/uL (ref 4.0–10.5)

## 2014-12-12 LAB — ABO/RH: ABO/RH(D): O POS

## 2014-12-12 MED ORDER — CEFAZOLIN SODIUM-DEXTROSE 2-3 GM-% IV SOLR: 2.0000 g | INTRAVENOUS | Status: DC

## 2014-12-12 NOTE — H&P (Addendum)
Deborah Bowman is a 44 y.o. female presenting for repeat c-section and tubal ligation  History OB History    Gravida Para Term Preterm AB TAB SAB Ectopic Multiple Living   5 2 2  2  2   2      Past Medical History  Diagnosis Date  . Hx of varicella   . Hypothyroidism   . AMA (advanced maternal age) multigravida 35+    Past Surgical History  Procedure Laterality Date  . Paraovarian cyst removal      and tube ? side  . Dilation and curettage of uterus    . Bunionectomy  1996  . Wisdom tooth extraction    . Cesarean section N/A 12/25/2012    Procedure: CESAREAN SECTION;  Surgeon: Zelphia CairoGretchen Maylie Ashton, MD;  Location: [facility];  Service: Obstetrics;  Laterality: N/A;  Primary Cesarean Section Delivery Baby Boy @ [phone], Apgars 8/9   Family History: family history includes Depression in her paternal grandmother; Heart disease in her maternal grandfather and maternal grandmother; Hypertension in her maternal grandmother; Hypothyroidism in her father. Social History:  reports that she has never smoked. She has never used smokeless tobacco. She reports that she does not drink alcohol or use illicit drugs.   Prenatal Transfer Tool  Maternal Diabetes: No Genetic Screening: Normal Maternal Ultrasounds/Referrals: Normal Fetal Ultrasounds or other Referrals:  None Maternal Substance Abuse:  No Significant Maternal Medications:  None Significant Maternal Lab Results:  None Other Comments:  None  ROS    Last menstrual period 03/05/2014, unknown if currently breastfeeding. Exam Physical Exam  Prenatal labs: ABO, Rh: --/--/PENDING (05/23 1005) Antibody: NEG (05/23 1005) Rubella: Immune (10/14 0000) RPR: Nonreactive (10/14 0000)  HBsAg: Negative (10/14 0000)  HIV: Non-reactive (10/14 0000)  GBS:     Assessment/Plan: Previous c-section, desires rpt and sterilization Repeat c-section and BPS R/b/a discussed, questions answered, informed consent   Deborah Bowman 12/12/2014, 12:50  PM

## 2014-12-12 NOTE — Patient Instructions (Signed)
Your procedure is scheduled on:12/13/14  Enter through the Main Entrance at :1130 am Pick up desk phone and dial [phone] and inform us of your arrival.  Please call [phone] if you have any problems the morning of surgery.  Remember: Do not eat food  after midnight:tonight Clear liquids are ok until:9am on Tuesday   You may brush your teeth the morning of surgery.  Take these meds the morning of surgery with a sip of water:Synthroid  DO NOT wear jewelry, eye make-up, lipstick,body lotion, or dark fingernail polish.  (Polished toes are ok) You may wear deodorant.  If you are to be admitted after surgery, leave suitcase in car until your room has been assigned. Patients discharged on the day of surgery will not be allowed to drive home. Wear loose fitting, comfortable clothes for your ride home.

## 2014-12-13 ENCOUNTER — Encounter (HOSPITAL_COMMUNITY): Payer: Self-pay | Admitting: *Deleted

## 2014-12-13 ENCOUNTER — Inpatient Hospital Stay (HOSPITAL_COMMUNITY)
Admission: AD | Admit: 2014-12-13 | Discharge: 2014-12-16 | DRG: 766 | Disposition: A | Discharge status: Home / Self Care | Payer: 59 | Source: Ambulatory Visit | Attending: Obstetrics and Gynecology | Admitting: Obstetrics and Gynecology

## 2014-12-13 ENCOUNTER — Inpatient Hospital Stay (HOSPITAL_COMMUNITY): Payer: 59 | Admitting: Anesthesiology

## 2014-12-13 ENCOUNTER — Encounter (HOSPITAL_COMMUNITY)
Admission: AD | Disposition: A | Discharge status: Home / Self Care | Payer: Self-pay | Source: Ambulatory Visit | Attending: Obstetrics and Gynecology

## 2014-12-13 DIAGNOSIS — O09523 Supervision of elderly multigravida, third trimester: Secondary | ICD-10-CM | POA: Diagnosis present

## 2014-12-13 DIAGNOSIS — Z98891 History of uterine scar from previous surgery: Secondary | ICD-10-CM

## 2014-12-13 DIAGNOSIS — Z3A40 40 weeks gestation of pregnancy: Secondary | ICD-10-CM | POA: Diagnosis present

## 2014-12-13 DIAGNOSIS — O480 Post-term pregnancy: Secondary | ICD-10-CM | POA: Diagnosis present

## 2014-12-13 DIAGNOSIS — O3421 Maternal care for scar from previous cesarean delivery: Secondary | ICD-10-CM | POA: Diagnosis present

## 2014-12-13 DIAGNOSIS — Z302 Encounter for sterilization: Secondary | ICD-10-CM

## 2014-12-13 LAB — RPR: RPR Ser Ql: NONREACTIVE

## 2014-12-13 SURGERY — Surgical Case
Anesthesia: Spinal | Laterality: Right

## 2014-12-13 MED ORDER — SIMETHICONE 80 MG PO CHEW
80.0000 mg | CHEWABLE_TABLET | Freq: Three times a day (TID) | ORAL | Status: DC
  Administered 2014-12-13 – 2014-12-16 (×7): 80 mg via ORAL
  Filled 2014-12-13 (×7): qty 1

## 2014-12-13 MED ORDER — GLYCOPYRROLATE 0.2 MG/ML IJ SOLN
INTRAMUSCULAR | Status: AC
  Filled 2014-12-13: qty 1

## 2014-12-13 MED ORDER — SENNOSIDES-DOCUSATE SODIUM 8.6-50 MG PO TABS
2.0000 | ORAL_TABLET | ORAL | Status: DC
  Administered 2014-12-14 – 2014-12-15 (×3): 2 via ORAL
  Filled 2014-12-13 (×3): qty 2

## 2014-12-13 MED ORDER — KETOROLAC TROMETHAMINE 30 MG/ML IJ SOLN: 30.0000 mg | Freq: Four times a day (QID) | INTRAMUSCULAR | Status: DC | PRN

## 2014-12-13 MED ORDER — NALBUPHINE HCL 10 MG/ML IJ SOLN: 5.0000 mg | Freq: Once | INTRAMUSCULAR | Status: AC | PRN

## 2014-12-13 MED ORDER — ONDANSETRON HCL 4 MG/2ML IJ SOLN: 4.0000 mg | Freq: Three times a day (TID) | INTRAMUSCULAR | Status: DC | PRN

## 2014-12-13 MED ORDER — PHENYLEPHRINE 8 MG IN D5W 100 ML (0.08MG/ML) PREMIX OPTIME
INJECTION | INTRAVENOUS | Status: AC
  Filled 2014-12-13: qty 100

## 2014-12-13 MED ORDER — OXYTOCIN 40 UNITS IN LACTATED RINGERS INFUSION - SIMPLE MED: 62.5000 mL/h | INTRAVENOUS | Status: AC

## 2014-12-13 MED ORDER — IBUPROFEN 600 MG PO TABS
600.0000 mg | ORAL_TABLET | Freq: Four times a day (QID) | ORAL | Status: DC
  Administered 2014-12-14 – 2014-12-16 (×10): 600 mg via ORAL
  Filled 2014-12-13 (×10): qty 1

## 2014-12-13 MED ORDER — OXYCODONE-ACETAMINOPHEN 5-325 MG PO TABS
2.0000 | ORAL_TABLET | ORAL | Status: DC | PRN
  Administered 2014-12-14 – 2014-12-15 (×3): 2 via ORAL
  Filled 2014-12-13 (×4): qty 2

## 2014-12-13 MED ORDER — FENTANYL CITRATE (PF) 100 MCG/2ML IJ SOLN: 25.0000 ug | INTRAMUSCULAR | Status: DC | PRN

## 2014-12-13 MED ORDER — FENTANYL CITRATE (PF) 100 MCG/2ML IJ SOLN
INTRAMUSCULAR | Status: DC | PRN
  Administered 2014-12-13: 25 ug via INTRATHECAL

## 2014-12-13 MED ORDER — NALOXONE HCL 0.4 MG/ML IJ SOLN: 0.4000 mg | INTRAMUSCULAR | Status: DC | PRN

## 2014-12-13 MED ORDER — OXYCODONE-ACETAMINOPHEN 5-325 MG PO TABS
1.0000 | ORAL_TABLET | ORAL | Status: DC | PRN
  Administered 2014-12-14 – 2014-12-15 (×4): 1 via ORAL
  Filled 2014-12-13 (×3): qty 1

## 2014-12-13 MED ORDER — SCOPOLAMINE 1 MG/3DAYS TD PT72
MEDICATED_PATCH | TRANSDERMAL | Status: AC
  Administered 2014-12-13: 1.5 mg via TRANSDERMAL
  Filled 2014-12-13: qty 1

## 2014-12-13 MED ORDER — LANOLIN HYDROUS EX OINT: 1.0000 "application " | TOPICAL_OINTMENT | CUTANEOUS | Status: DC | PRN

## 2014-12-13 MED ORDER — DIBUCAINE 1 % RE OINT: 1.0000 "application " | TOPICAL_OINTMENT | RECTAL | Status: DC | PRN

## 2014-12-13 MED ORDER — SIMETHICONE 80 MG PO CHEW: 80.0000 mg | CHEWABLE_TABLET | ORAL | Status: DC | PRN

## 2014-12-13 MED ORDER — PHENYLEPHRINE 8 MG IN D5W 100 ML (0.08MG/ML) PREMIX OPTIME
INJECTION | INTRAVENOUS | Status: DC | PRN
  Administered 2014-12-13: 60 ug/min via INTRAVENOUS

## 2014-12-13 MED ORDER — MEPERIDINE HCL 25 MG/ML IJ SOLN: 6.2500 mg | INTRAMUSCULAR | Status: DC | PRN

## 2014-12-13 MED ORDER — NALBUPHINE HCL 10 MG/ML IJ SOLN: 5.0000 mg | INTRAMUSCULAR | Status: DC | PRN

## 2014-12-13 MED ORDER — MEASLES, MUMPS & RUBELLA VAC ~~LOC~~ INJ: 0.5000 mL | INJECTION | Freq: Once | SUBCUTANEOUS | Status: DC

## 2014-12-13 MED ORDER — WITCH HAZEL-GLYCERIN EX PADS: 1.0000 "application " | MEDICATED_PAD | CUTANEOUS | Status: DC | PRN

## 2014-12-13 MED ORDER — TETANUS-DIPHTH-ACELL PERTUSSIS 5-2.5-18.5 LF-MCG/0.5 IM SUSP: 0.5000 mL | Freq: Once | INTRAMUSCULAR | Status: DC

## 2014-12-13 MED ORDER — LACTATED RINGERS IV SOLN
40.0000 [IU] | INTRAVENOUS | Status: DC | PRN
  Administered 2014-12-13: 40 [IU] via INTRAVENOUS

## 2014-12-13 MED ORDER — EPHEDRINE 5 MG/ML INJ
INTRAVENOUS | Status: AC
  Filled 2014-12-13: qty 10

## 2014-12-13 MED ORDER — SIMETHICONE 80 MG PO CHEW
80.0000 mg | CHEWABLE_TABLET | ORAL | Status: DC
  Administered 2014-12-14 – 2014-12-15 (×3): 80 mg via ORAL
  Filled 2014-12-13 (×3): qty 1

## 2014-12-13 MED ORDER — MEDROXYPROGESTERONE ACETATE 150 MG/ML IM SUSP: 150.0000 mg | INTRAMUSCULAR | Status: DC | PRN

## 2014-12-13 MED ORDER — KETOROLAC TROMETHAMINE 30 MG/ML IJ SOLN
INTRAMUSCULAR | Status: AC
Start: 2014-12-13 — End: 2014-12-14
  Filled 2014-12-13: qty 1

## 2014-12-13 MED ORDER — ONDANSETRON HCL 4 MG/2ML IJ SOLN
INTRAMUSCULAR | Status: AC
  Filled 2014-12-13: qty 2

## 2014-12-13 MED ORDER — LEVOTHYROXINE SODIUM 88 MCG PO TABS
88.0000 ug | ORAL_TABLET | Freq: Every day | ORAL | Status: DC
  Administered 2014-12-14 – 2014-12-16 (×3): 88 ug via ORAL
  Filled 2014-12-13 (×3): qty 1

## 2014-12-13 MED ORDER — PRENATAL MULTIVITAMIN CH
1.0000 | ORAL_TABLET | Freq: Every day | ORAL | Status: DC
  Administered 2014-12-14 – 2014-12-15 (×2): 1 via ORAL
  Filled 2014-12-13 (×2): qty 1

## 2014-12-13 MED ORDER — DIPHENHYDRAMINE HCL 50 MG/ML IJ SOLN: 12.5000 mg | INTRAMUSCULAR | Status: DC | PRN

## 2014-12-13 MED ORDER — DIPHENHYDRAMINE HCL 25 MG PO CAPS: 25.0000 mg | ORAL_CAPSULE | Freq: Four times a day (QID) | ORAL | Status: DC | PRN

## 2014-12-13 MED ORDER — MORPHINE SULFATE (PF) 0.5 MG/ML IJ SOLN
INTRAMUSCULAR | Status: DC | PRN
  Administered 2014-12-13: .15 mg via EPIDURAL

## 2014-12-13 MED ORDER — ACETAMINOPHEN 325 MG PO TABS: 650.0000 mg | ORAL_TABLET | ORAL | Status: DC | PRN

## 2014-12-13 MED ORDER — SCOPOLAMINE 1 MG/3DAYS TD PT72: 1.0000 | MEDICATED_PATCH | Freq: Once | TRANSDERMAL | Status: DC

## 2014-12-13 MED ORDER — DEXTROSE IN LACTATED RINGERS 5 % IV SOLN
INTRAVENOUS | Status: DC
  Administered 2014-12-14: 01:00:00 via INTRAVENOUS

## 2014-12-13 MED ORDER — OXYTOCIN 10 UNIT/ML IJ SOLN
INTRAMUSCULAR | Status: AC
  Filled 2014-12-13: qty 4

## 2014-12-13 MED ORDER — LACTATED RINGERS IV SOLN
Freq: Once | INTRAVENOUS | Status: AC
  Administered 2014-12-13: 12:00:00 via INTRAVENOUS

## 2014-12-13 MED ORDER — MENTHOL 3 MG MT LOZG
1.0000 | LOZENGE | OROMUCOSAL | Status: DC | PRN
Start: 2014-12-13 — End: 2014-12-16

## 2014-12-13 MED ORDER — CEFAZOLIN SODIUM-DEXTROSE 2-3 GM-% IV SOLR
INTRAVENOUS | Status: AC
Start: 2014-12-13 — End: 2014-12-13
  Filled 2014-12-13: qty 50

## 2014-12-13 MED ORDER — MORPHINE SULFATE 0.5 MG/ML IJ SOLN
INTRAMUSCULAR | Status: AC
  Filled 2014-12-13: qty 10

## 2014-12-13 MED ORDER — LACTATED RINGERS IV SOLN
INTRAVENOUS | Status: DC | PRN
  Administered 2014-12-13: 13:00:00 via INTRAVENOUS

## 2014-12-13 MED ORDER — SODIUM CHLORIDE 0.9 % IJ SOLN: 3.0000 mL | INTRAMUSCULAR | Status: DC | PRN

## 2014-12-13 MED ORDER — EPHEDRINE SULFATE 50 MG/ML IJ SOLN
INTRAMUSCULAR | Status: DC | PRN
  Administered 2014-12-13 (×2): 10 mg via INTRAVENOUS

## 2014-12-13 MED ORDER — FENTANYL CITRATE (PF) 100 MCG/2ML IJ SOLN
INTRAMUSCULAR | Status: AC
Start: 2014-12-13 — End: 2014-12-13
  Filled 2014-12-13: qty 2

## 2014-12-13 MED ORDER — PROMETHAZINE HCL 25 MG/ML IJ SOLN
6.2500 mg | INTRAMUSCULAR | Status: DC | PRN
Start: 2014-12-13 — End: 2014-12-13

## 2014-12-13 MED ORDER — NALOXONE HCL 1 MG/ML IJ SOLN: 1.0000 ug/kg/h | INTRAMUSCULAR | Status: DC | PRN

## 2014-12-13 MED ORDER — DIPHENHYDRAMINE HCL 25 MG PO CAPS: 25.0000 mg | ORAL_CAPSULE | ORAL | Status: DC | PRN

## 2014-12-13 MED ORDER — ONDANSETRON HCL 4 MG/2ML IJ SOLN
INTRAMUSCULAR | Status: DC | PRN
  Administered 2014-12-13: 4 mg via INTRAVENOUS

## 2014-12-13 MED ORDER — BUPIVACAINE IN DEXTROSE 0.75-8.25 % IT SOLN
INTRATHECAL | Status: DC | PRN
  Administered 2014-12-13: 1.4 mL via INTRATHECAL

## 2014-12-13 MED ORDER — KETOROLAC TROMETHAMINE 30 MG/ML IJ SOLN
30.0000 mg | Freq: Four times a day (QID) | INTRAMUSCULAR | Status: DC | PRN
  Administered 2014-12-13: 30 mg via INTRAMUSCULAR

## 2014-12-13 MED ORDER — ZOLPIDEM TARTRATE 5 MG PO TABS: 5.0000 mg | ORAL_TABLET | Freq: Every evening | ORAL | Status: DC | PRN

## 2014-12-13 SURGICAL SUPPLY — 27 items
CLAMP CORD UMBIL (MISCELLANEOUS) IMPLANT
CLOTH BEACON ORANGE TIMEOUT ST (SAFETY) ×2 IMPLANT
DRAPE SHEET LG 3/4 BI-LAMINATE (DRAPES) IMPLANT
DRSG OPSITE POSTOP 4X10 (GAUZE/BANDAGES/DRESSINGS) ×2 IMPLANT
DURAPREP 26ML APPLICATOR (WOUND CARE) ×2 IMPLANT
ELECT REM PT RETURN 9FT ADLT (ELECTROSURGICAL) ×2
ELECTRODE REM PT RTRN 9FT ADLT (ELECTROSURGICAL) ×1 IMPLANT
EXTRACTOR VACUUM M CUP 4 TUBE (SUCTIONS) IMPLANT
GLOVE BIO SURGEON STRL SZ 6.5 (GLOVE) ×2 IMPLANT
GLOVE BIOGEL PI IND STRL 7.0 (GLOVE) ×1 IMPLANT
GLOVE BIOGEL PI INDICATOR 7.0 (GLOVE) ×1
GOWN STRL REUS W/TWL LRG LVL3 (GOWN DISPOSABLE) ×4 IMPLANT
KIT ABG SYR 3ML LUER SLIP (SYRINGE) IMPLANT
LIQUID BAND (GAUZE/BANDAGES/DRESSINGS) ×2 IMPLANT
NEEDLE HYPO 25X5/8 SAFETYGLIDE (NEEDLE) IMPLANT
NS IRRIG 1000ML POUR BTL (IV SOLUTION) ×2 IMPLANT
PACK C SECTION WH (CUSTOM PROCEDURE TRAY) ×2 IMPLANT
PAD OB MATERNITY 4.3X12.25 (PERSONAL CARE ITEMS) ×2 IMPLANT
STAPLER VISISTAT 35W (STAPLE) IMPLANT
SUT CHROMIC 0 CT 802H (SUTURE) IMPLANT
SUT CHROMIC 0 CTX 36 (SUTURE) ×6 IMPLANT
SUT MON AB-0 CT1 36 (SUTURE) ×2 IMPLANT
SUT PDS AB 0 CTX 60 (SUTURE) ×2 IMPLANT
SUT PLAIN 0 NONE (SUTURE) IMPLANT
SUT VIC AB 4-0 KS 27 (SUTURE) ×2 IMPLANT
TOWEL OR 17X24 6PK STRL BLUE (TOWEL DISPOSABLE) ×2 IMPLANT
TRAY FOLEY CATH SILVER 14FR (SET/KITS/TRAYS/PACK) IMPLANT

## 2014-12-13 NOTE — Anesthesia Procedure Notes (Signed)
Spinal Patient location during procedure: OR Start time: 12/13/2014 12:55 PM Staffing Anesthesiologist: Mal AmabileFOSTER, Shaquna Geigle Performed by: anesthesiologist  Preanesthetic Checklist Completed: patient identified, site marked, surgical consent, pre-op evaluation, timeout performed, IV checked, risks and benefits discussed and monitors and equipment checked Spinal Block Patient position: sitting Prep: site prepped and draped and DuraPrep Patient monitoring: heart rate, cardiac monitor, continuous pulse ox and blood pressure Approach: midline Location: L4-5 Injection technique: single-shot Needle Needle type: Sprotte  Needle gauge: 24 G Needle length: 9 cm Needle insertion depth: 0.5 cm Assessment Sensory level: T2 Additional Notes Patient tolerated procedure well. Adequate sensory level.

## 2014-12-13 NOTE — Anesthesia Postprocedure Evaluation (Signed)
Anesthesia Post Note  Patient: Deborah Bowman  Procedure(s) Performed: Procedure(s) (LRB): REPEAT CESAREAN SECTION WITH RIGHT PARTIAL SALPINGECTOMY (Right)  Anesthesia type: SAB  Patient location: PACU  Post pain: Pain level controlled  Post assessment: Patient's Cardiovascular Status Stable  Last Vitals:  Filed Vitals:   12/13/14 1445  BP: 94/47  Pulse: 67  Temp:   Resp: 20    Post vital signs: Reviewed and stable  Level of consciousness: sedated  Complications: No apparent anesthesia complications

## 2014-12-13 NOTE — Op Note (Signed)
Cesarean Section Procedure Note   Deborah CorneaDana G Hammersmith  12/13/2014  Indications: Scheduled Proceedure/Maternal Request and desires sterilization   Pre-operative Diagnosis: post date,DESIRE STERILIZATION.   Post-operative Diagnosis: Same   Surgeon: Surgeon(s) and Role:    * Zelphia CairoGretchen Tijuan Dantes, MD - Primary   Assistants: none  Anesthesia: spinal   Procedure Details:  The patient was seen in the Holding Room. The risks, benefits, complications, treatment options, and expected outcomes were discussed with the patient. The patient concurred with the proposed plan, giving informed consent. identified as Deborah Corneaana G Valeri and the procedure verified as C-Section Delivery. A Time Out was held and the above information confirmed.  After induction of anesthesia, the patient was draped and prepped in the usual sterile manner. A transverse was made and carried down through the subcutaneous tissue to the fascia. Fascial incision was made and extended transversely. The fascia was separated from the underlying rectus tissue superiorly and inferiorly. The peritoneum was identified and entered. Peritoneal incision was extended longitudinally. The utero-vesical peritoneal reflection was incised transversely and the bladder flap was bluntly freed from the lower uterine segment. A low transverse uterine incision was made. Delivered from cephalic presentation was a viable female infant with Apgar scores pending NICU evaluation. Cord ph was not sent the umbilical cord was clamped and cut cord blood was obtained for evaluation. The placenta was removed Intact and appeared normal. The uterine outline, tubes and ovaries appeared c/w absent left fallopian tube}. The uterine incision was closed with running locked sutures of 0chromic gut.   Hemostasis was observed. Lavage was carried out until clear.  Right fallopian tube was grasped with a babcock and doubly tied with plain gut suture.  Knuckle of tube was excised and passed off to  pathology.  Hemostasis was observed.  The fascia was then reapproximated with running sutures of 0PDS. The skin was closed with 4-0Vicryl.   Instrument, sponge, and needle counts were correct prior the abdominal closure and were correct at the conclusion of the case.     Estimated Blood Loss: 700cc  Urine Output: clear  Specimens: placenta for disposal.  Right fallopian tube to pathology   Complications: no complications  Disposition: PACU - hemodynamically stable.   Maternal Condition: stable   Baby condition / location:  Couplet care / Skin to Skin  Attending Attestation: I was present and scrubbed for the entire procedure.   Signed: Surgeon(s): Zelphia CairoGretchen Othel Hoogendoorn, MD

## 2014-12-13 NOTE — Transfer of Care (Signed)
Immediate Anesthesia Transfer of Care Note  Patient: Deborah CorneaDana G Weigel  Procedure(s) Performed: Procedure(s): REPEAT CESAREAN SECTION WITH RIGHT PARTIAL SALPINGECTOMY (Right)  Patient Location: PACU  Anesthesia Type:Spinal  Level of Consciousness: awake, alert  and oriented  Airway & Oxygen Therapy: Patient Spontanous Breathing  Post-op Assessment: Report given to RN and Post -op Vital signs reviewed and stable  Post vital signs: Reviewed and stable  Last Vitals:  Filed Vitals:   12/13/14 1140  BP: 132/73  Pulse: 75  Temp: 36.7 C    Complications: No apparent anesthesia complications

## 2014-12-13 NOTE — Lactation Note (Signed)
This note was copied from the chart of Deborah Carloyn JaegerDana Dosanjh. Lactation Consultation Note  Patient Name: Deborah Bowman MWUXL'KToday's Date: 12/13/2014 Reason for consult: Initial assessment Mom had baby latched to left breast when I arrived. Baby demonstrating rhythmic suckling bursts with some swallows noted. Nipple was round when baby came off the breast. Mom reports baby nursed on right breast prior and she has some tenderness. Demonstrated hand expression and advised to apply EBM to sore nipple. Mom reports her 2nd baby had upper lip labial tongue tie that required frenotomy after she had few weeks of nipple trauma. This baby did not appear to have labial frenulum to this LC. Baby was able to keep his upper lip well flanged when at the breast. Was not able to assess for lingual frenulum. Basic teaching reviewed with Mom. Advised to BF with feeding ques. Lactation brochure left for review, advised of OP services and support group. Discussed positioning and how to bring bottom lip down with nursing. Encouraged Mom to call for assist as needed.   Maternal Data Has patient been taught Hand Expression?: Yes Does the patient have breastfeeding experience prior to this delivery?: Yes  Feeding Feeding Type: Breast Fed Length of feed: 50 min  LATCH Score/Interventions Latch: Grasps breast easily, tongue down, lips flanged, rhythmical sucking.  Audible Swallowing: A few with stimulation  Type of Nipple: Everted at rest and after stimulation  Comfort (Breast/Nipple): Soft / non-tender     Hold (Positioning): No assistance needed to correctly position infant at breast. Intervention(s): Breastfeeding basics reviewed;Support Pillows;Position options;Skin to skin  LATCH Score: 9  Lactation Tools Discussed/Used     Consult Status Consult Status: Follow-up Date: 12/14/14 Follow-up type: In-patient    Deborah Bowman, Deborah Bowman 12/13/2014, 7:45 PM

## 2014-12-13 NOTE — Anesthesia Preprocedure Evaluation (Signed)
Anesthesia Evaluation  Patient identified by MRN, date of birth, ID band Patient awake    Reviewed: Allergy & Precautions, H&P , NPO status , Patient's Chart, lab work & pertinent test results  Airway Mallampati: II  TM Distance: >3 FB Neck ROM: full    Dental  (+) Teeth Intact, Dental Advidsory Given   Pulmonary neg pulmonary ROS,  breath sounds clear to auscultation        Cardiovascular negative cardio ROS  Rhythm:regular Rate:Normal     Neuro/Psych negative neurological ROS  negative psych ROS   GI/Hepatic negative GI ROS, Neg liver ROS,   Endo/Other  Hypothyroidism   Renal/GU negative Renal ROS     Musculoskeletal   Abdominal   Peds  Hematology   Anesthesia Other Findings   Reproductive/Obstetrics (+) Pregnancy                             Anesthesia Physical Anesthesia Plan  ASA: II  Anesthesia Plan: Spinal   Post-op Pain Management:    Induction:   Airway Management Planned:   Additional Equipment:   Intra-op Plan:   Post-operative Plan:   Informed Consent: I have reviewed the patients History and Physical, chart, labs and discussed the procedure including the risks, benefits and alternatives for the proposed anesthesia with the patient or authorized representative who has indicated his/her understanding and acceptance.   Dental Advisory Given  Plan Discussed with: Anesthesiologist  Anesthesia Plan Comments:         Anesthesia Quick Evaluation

## 2014-12-14 ENCOUNTER — Encounter (HOSPITAL_COMMUNITY): Payer: Self-pay | Admitting: Obstetrics and Gynecology

## 2014-12-14 LAB — CBC
HCT: 27.1 % — ABNORMAL LOW (ref 36.0–46.0)
Hemoglobin: 9 g/dL — ABNORMAL LOW (ref 12.0–15.0)
MCH: 31.7 pg (ref 26.0–34.0)
MCHC: 33.2 g/dL (ref 30.0–36.0)
MCV: 95.4 fL (ref 78.0–100.0)
Platelets: 133 10*3/uL — ABNORMAL LOW (ref 150–400)
RBC: 2.84 MIL/uL — ABNORMAL LOW (ref 3.87–5.11)
RDW: 14.3 % (ref 11.5–15.5)
WBC: 13.8 10*3/uL — ABNORMAL HIGH (ref 4.0–10.5)

## 2014-12-14 LAB — BIRTH TISSUE RECOVERY COLLECTION (PLACENTA DONATION)

## 2014-12-14 NOTE — Lactation Note (Signed)
This note was copied from the chart of Deborah Carloyn JaegerDana Abel. Lactation Consultation Note: Mother paged to have latch assessed. Observed that infant lips were pursed and shallow. Adjusted mother's position and infants latch. Observed intermittent swallows with better depth. Observed 15 min feeding. Mother taught breast compression. Advised in use of good support and holding infant close. Mother receptive to all teaching.   Patient Name: Deborah Bowman WUJWJ'XToday's Date: 12/14/2014 Reason for consult: Follow-up assessment   Maternal Data    Feeding Feeding Type: Breast Fed Length of feed: 15 min  LATCH Score/Interventions Latch: Grasps breast easily, tongue down, lips flanged, rhythmical sucking.  Audible Swallowing: A few with stimulation Intervention(s): Skin to skin  Type of Nipple: Everted at rest and after stimulation  Comfort (Breast/Nipple): Soft / non-tender     Hold (Positioning): No assistance needed to correctly position infant at breast. Intervention(s): Position options  LATCH Score: 9  Lactation Tools Discussed/Used     Consult Status Consult Status: Follow-up Date: 12/14/14 Follow-up type: In-patient    Stevan BornKendrick, Serigne Kubicek [facility] 12/14/2014, 2:21 PM

## 2014-12-14 NOTE — Addendum Note (Signed)
Addendum  created 12/14/14 1016 by Elgie CongoNataliya H Deadrick Stidd, CRNA   Modules edited: Notes Section   Notes Section:  File: [id number]

## 2014-12-14 NOTE — Progress Notes (Signed)
Patient desires circ.  Counseled re: risk of bleeding, infection, and scarring.  All questions were answered and the patient wishes to proceed.  Mitchel HonourMegan Abbegayle Denault, DO

## 2014-12-14 NOTE — Progress Notes (Signed)
Subjective: Postpartum Day 1: Cesarean Delivery Patient reports tolerating PO.    Objective: Vital signs in last 24 hours: Temp:  [97.8 F (36.6 C)-99.1 F (37.3 C)] 98.2 F (36.8 C) (05/25 0556) Pulse Rate:  [60-77] 68 (05/25 0556) Resp:  [18-28] 18 (05/25 0556) BP: (92-132)/(47-73) 94/54 mmHg (05/25 0556) SpO2:  [93 %-100 %] 96 % (05/25 0556)  Physical Exam:  General: alert and cooperative Lochia: appropriate Uterine Fundus: firm Incision: healing well DVT Evaluation: No evidence of DVT seen on physical exam. Negative Homan's sign. No cords or calf tenderness. No significant calf/ankle edema.   Recent Labs  12/12/14 1005 12/14/14 0606  HGB 11.2* 9.0*  HCT 33.6* 27.1*    Assessment/Plan: Status post Cesarean section. Doing well postoperatively.  Continue current care.  CURTIS,CAROL G 12/14/2014, 8:13 AM

## 2014-12-14 NOTE — Anesthesia Postprocedure Evaluation (Signed)
  Anesthesia Post-op Note  Patient: Deborah Bowman  Procedure(s) Performed: Procedure(s): REPEAT CESAREAN SECTION WITH RIGHT PARTIAL SALPINGECTOMY (Right)  Patient Location: PACU and Mother/Baby  Anesthesia Type:Spinal  Level of Consciousness: awake, alert  and oriented  Airway and Oxygen Therapy: Patient Spontanous Breathing  Post-op Pain: none  Post-op Assessment: Post-op Vital signs reviewed and Patient's Cardiovascular Status Stable  Post-op Vital Signs: Reviewed and stable  Last Vitals:  Filed Vitals:   12/14/14 0556  BP: 94/54  Pulse: 68  Temp: 36.8 C  Resp: 18    Complications: No apparent anesthesia complications

## 2014-12-15 NOTE — Progress Notes (Signed)
Attended 'Well After Birth' group class which presented discharge care information for both Mom and Baby. 

## 2014-12-15 NOTE — Progress Notes (Signed)
Subjective: Postpartum Day 2: Cesarean Delivery Patient reports incisional pain, tolerating PO, + flatus and no problems voiding.    Objective: Vital signs in last 24 hours: Temp:  [97.6 F (36.4 C)-98.5 F (36.9 C)] 98.1 F (36.7 C) (05/26 0640) Pulse Rate:  [62-78] 62 (05/26 0640) Resp:  [16-18] 18 (05/26 0640) BP: (93-123)/(63-76) 105/66 mmHg (05/26 0640) SpO2:  [96 %] 96 % (05/25 1952)  Physical Exam:  General: alert and cooperative Lochia: appropriate Uterine Fundus: firm Incision: healing well DVT Evaluation: No evidence of DVT seen on physical exam. Negative Homan's sign. No cords or calf tenderness. No significant calf/ankle edema.   Recent Labs  12/12/14 1005 12/14/14 0606  HGB 11.2* 9.0*  HCT 33.6* 27.1*    Assessment/Plan: Status post Cesarean section. Doing well postoperatively.  Continue current care.  CURTIS,CAROL G 12/15/2014, 8:31 AM

## 2014-12-16 MED ORDER — IBUPROFEN 600 MG PO TABS: 600.0000 mg | ORAL_TABLET | Freq: Four times a day (QID) | ORAL | Status: DC

## 2014-12-16 MED ORDER — OXYCODONE-ACETAMINOPHEN 5-325 MG PO TABS: 1.0000 | ORAL_TABLET | ORAL | Status: DC | PRN

## 2014-12-16 NOTE — Discharge Summary (Signed)
Obstetric Discharge Summary Reason for Admission: cesarean section Prenatal Procedures: ultrasound Intrapartum Procedures: cesarean: low cervical, transverse Postpartum Procedures: none Complications-Operative and Postpartum: none HEMOGLOBIN  Date Value Ref Range Status  12/14/2014 9.0* 12.0 - 15.0 g/dL Final   HCT  Date Value Ref Range Status  12/14/2014 27.1* 36.0 - 46.0 % Final    Physical Exam:  General: alert and cooperative Lochia: appropriate Uterine Fundus: firm Incision: healing well DVT Evaluation: No evidence of DVT seen on physical exam. Negative Homan's sign. No cords or calf tenderness. Calf/Ankle edema is present.  Discharge Diagnoses: Term Pregnancy-delivered  Discharge Information: Date: 12/16/2014 Activity: pelvic rest Diet: routine Medications: PNV, Ibuprofen and Percocet Condition: stable Instructions: refer to practice specific booklet Discharge to: home   Newborn Data: Live born female  Birth Weight: 8 lb 7.8 oz (3850 g) APGAR: 2, 8  Home with mother.  Deborah Bowman G 12/16/2014, 8:56 AM

## 2014-12-16 NOTE — Lactation Note (Signed)
This note was copied from the chart of Deborah Bowman Linford. Lactation Consultation Note  P3, Ex BF.  Baby latched in cradle hold upon entering.  Lips flanged, sucks and swallows observed. Reminded mother about breast compression to keep him active. Parents asked for another set of comfort gels and reviewed use and engorgement. Encouraged mother to call if she needs further assitance.  Patient Name: Deborah Bowman Stencel ZOXWR'UToday's Date: 12/16/2014 Reason for consult: Follow-up assessment   Maternal Data    Feeding Feeding Type: Breast Milk Length of feed: 15 min  LATCH Score/Interventions Latch: Grasps breast easily, tongue down, lips flanged, rhythmical sucking. (latched upon entering)  Audible Swallowing: Spontaneous and intermittent  Type of Nipple: Everted at rest and after stimulation  Comfort (Breast/Nipple): Filling, red/small blisters or bruises, mild/mod discomfort  Problem noted: Mild/Moderate discomfort Interventions (Mild/moderate discomfort): Comfort gels;Hand expression  Hold (Positioning): No assistance needed to correctly position infant at breast.  LATCH Score: 9  Lactation Tools Discussed/Used     Consult Status Consult Status: Complete    Hardie PulleyBerkelhammer, Joee Iovine Boschen 12/16/2014, 9:57 AM

## 2015-01-20 ENCOUNTER — Other Ambulatory Visit: Payer: Self-pay | Admitting: Obstetrics and Gynecology

## 2015-01-24 LAB — CYTOLOGY - PAP

## 2016-08-20 DIAGNOSIS — Z23 Encounter for immunization: Secondary | ICD-10-CM | POA: Diagnosis not present

## 2016-08-26 ENCOUNTER — Encounter: Payer: Self-pay | Admitting: Sports Medicine

## 2016-08-26 ENCOUNTER — Ambulatory Visit (INDEPENDENT_AMBULATORY_CARE_PROVIDER_SITE_OTHER): Payer: 59 | Admitting: Sports Medicine

## 2016-08-26 DIAGNOSIS — M25572 Pain in left ankle and joints of left foot: Secondary | ICD-10-CM | POA: Diagnosis not present

## 2016-08-26 DIAGNOSIS — M25571 Pain in right ankle and joints of right foot: Secondary | ICD-10-CM

## 2016-08-26 NOTE — Progress Notes (Signed)
   Subjective:    Patient ID: Deborah Bowman, female    DOB: 1970/10/11, 46 y.o.   MRN: [id number]  HPI Deborah Bowman is a 46 yo F w/ PMH of R. Ankle fracture (teen years) in for evaluation of bilateral ankle pain. States she is training for her 5th full marathon, but due to time constraints, is only running 1 day/week. Describes R. Sided "dull" ache over medial malleolus brought on with runs and inversion/eversion of foot. Also describes L. Sided pain over achilles worsened with runs and plantar/dorsiflexion. Takes NSAIDs PRN w/ minimal relief. She wears orthotics for "flat feet" that she purchased OTC.  Review of Systems  No instability or laxity. As per HPI.     Objective:   Physical Exam  General: well appearing F in NAD, A&Ox3  Foot Exam:  - Pes planus w/ absent medial longitudinal arch and transverse arch - PTP over L. Medial malleolus, worsened w/ inversion/eversion - Positive talar tilt on R. - PTP over R. Achilles worsened w/ plantarflexion/dorsiflexion, nl calf squeeze - No Soft tissue swelling, no effusion  Gait: - 10 degrees out-toeing w/ pronation    Assessment & Plan:  46 yo F currently training for marathon with only 1 run/week with achilles pain and frank pes planus on exam. Likely achilles tendinitis exacerbated by insufficient training regimen. Pes planus likely places significant stress on medial aspect of midfoot. Advised against continuing training if unable to run more frequently.  1. Temporary orthotics- bilateral scaphoid pad w/ heel lift 2. Continue activity as tolerated and start eccentric heel drop exercises 3. Return in 3 weeks to assess progress and consider custom orthotics

## 2016-09-17 ENCOUNTER — Ambulatory Visit: Payer: 59 | Admitting: Sports Medicine

## 2016-09-24 ENCOUNTER — Ambulatory Visit: Payer: 59 | Admitting: Sports Medicine

## 2016-09-25 DIAGNOSIS — Z1231 Encounter for screening mammogram for malignant neoplasm of breast: Secondary | ICD-10-CM | POA: Diagnosis not present

## 2016-09-26 ENCOUNTER — Ambulatory Visit (INDEPENDENT_AMBULATORY_CARE_PROVIDER_SITE_OTHER): Payer: 59 | Admitting: Sports Medicine

## 2016-09-26 ENCOUNTER — Encounter: Payer: Self-pay | Admitting: Sports Medicine

## 2016-09-26 DIAGNOSIS — R269 Unspecified abnormalities of gait and mobility: Secondary | ICD-10-CM

## 2016-09-26 DIAGNOSIS — M25311 Other instability, right shoulder: Secondary | ICD-10-CM | POA: Insufficient documentation

## 2016-09-26 NOTE — Assessment & Plan Note (Signed)
We'll treat this conservatively with rotator cuff exercises and scapular stabilization exercises. She will follow-up after her half marathon to reassess her feet and check her progress of her home exercise program. Did discuss physical therapy as an option for the future.

## 2016-09-26 NOTE — Assessment & Plan Note (Signed)
Given green insoles at last visit which have helped her ankle pain. She is not able to train for a half marathon. We'll see her after her half marathon is completed to do custom orthotics.

## 2016-09-26 NOTE — Progress Notes (Signed)
  Deborah Bowman - 46 y.o. female MRN [id number]  Date of birth: [birth year]  SUBJECTIVE:  Including CC & ROS.  CC: bilateral ankle pain and right shoulder pain  Patient presents in follow-up for bilateral ankle pain. She is a runner and was having ankle pain that occurred with running. She was training for a marathon and now has decreased this to a half marathon. She was having pain over her medial malleolus. she was wearing over-the-counter orthotics, which did not help much.  She was given green insoles and when she was seen a month ago and reports almost complete relief with them. She is now running 8 miles in the weekends.  She complains of right shoulder pain that is ongoing for the past few months. She has had at other times in her life but it has worsened recently. She reports that it has some subluxations is trying to do pushups or planks or reaching backwards.  She denies any neck pain and no numbness or tingling. She has no problem with overhead activity and let she has ridging posteriorly. She has a history of swelling when she was younger.  She reports that when her shoulder feels as though it pops out of joint it is very painful. She it is never popped out to the point that she feels that it had to be manually reduced.   ROS: No unexpected weight loss, fever, chills, swelling, muscle pain, numbness/tingling, redness, otherwise see HPI, +instability  PMHx - Updated and reviewed.  Contributory factors include: Hypothyroid PSHx - Updated and reviewed.  Contributory factors include:  Negative FHx - Updated and reviewed.  Contributory factors include:  Negative Social Hx - Updated and reviewed. Contributory factors include: Enjoys running Medications - reviewed   DATA REVIEWED: Previous office visits  PHYSICAL EXAM:  VS: BP:106/61  HR:61bpm  TEMP: ( )  RESP:   HT:5\' 3"  (160 cm)   WT:146 lb (66.2 kg)  BMI:25.9 PHYSICAL EXAM: Gen: NAD, alert, cooperative with exam,  well-appearing HEENT: clear conjunctiva,  CV:  no edema, capillary refill brisk, normal rate Resp: non-labored Skin: no rashes, normal turgor  Neuro: no gross deficits.  Psych:  alert and oriented  Shoulder: Inspection reveals no abnormalities, atrophy or asymmetry. Palpation is normal with no tenderness over AC joint or bicipital groove. ROM is full in all planes. Rotator cuff strength normal throughout. No signs of impingement with negative Neer and Hawkin's tests, empty can sign. Speeds and Yergason's tests normal. No labral pathology noted with negative Obrien's, negative clunk and good stability. Normal scapular function observed. No painful arc and no drop arm sign. Positive apprehension sign Anterior-posterior movement of the humerus on the glenoid shows some laxity bilaterally     ASSESSMENT & PLAN:   Shoulder instability, right We'll treat this conservatively with rotator cuff exercises and scapular stabilization exercises. She will follow-up after her half marathon to reassess her feet and check her progress of her home exercise program. Did discuss physical therapy as an option for the future.  Abnormality of gait Given green insoles at last visit which have helped her ankle pain. She is not able to train for a half marathon. We'll see her after her half marathon is completed to do custom orthotics.  Patient was counseled reviewing diagnosis and treatment in detail, totaling in 25 minutes, over half of which was spent in face to face counseling.

## 2016-10-31 ENCOUNTER — Encounter: Payer: Self-pay | Admitting: Sports Medicine

## 2016-10-31 ENCOUNTER — Ambulatory Visit (INDEPENDENT_AMBULATORY_CARE_PROVIDER_SITE_OTHER): Payer: 59 | Admitting: Sports Medicine

## 2016-10-31 VITALS — BP 104/62 | Ht 63.0 in | Wt 145.0 lb

## 2016-10-31 DIAGNOSIS — R269 Unspecified abnormalities of gait and mobility: Secondary | ICD-10-CM | POA: Diagnosis not present

## 2016-11-01 NOTE — Progress Notes (Signed)
   Subjective:    Patient ID: Deborah Bowman, female    DOB: 12/22/1970, 46 y.o.   MRN: [id number]  HPI   Patient comes in today for custom orthotics. She recently ran a half marathon with her green sports insoles. Her Achilles tendon pain has improved. She is without complaint today.    Review of Systems    as above Objective:   Physical Exam  Well-developed, well-nourished. No acute distress  Examination of her feet in the standing position shows pes planus bilaterally. She pronates when running. No limp.      Assessment & Plan:   Resolved Achilles tendinitis Pes planus Pronation  Custom orthotics were constructed today. Patient found them to be comfortable prior to leaving the office. She did note that she wasn't quite feeling the same degree of arch support that she was getting with her green sports insoles. I explained to her that we can add a scaphoid pad to her orthotics at a later date if needed. Gait analysis showed a neutral gait with walking and running. Total time spent with the patient was 30 minutes with greater than 50% of the time spent in face-to-face consultation discussing orthotic construction, instruction, fitting, and gait analysis. Follow-up as needed.  Patient was fitted for a : standard, cushioned, semi-rigid orthotic. The orthotic was heated and afterward the patient stood on the orthotic blank positioned on the orthotic stand. The patient was positioned in subtalar neutral position and 10 degrees of ankle dorsiflexion in a weight bearing stance. After completion of molding, a stable base was applied to the orthotic blank. The blank was ground to a stable position for weight bearing. Size: 6 Base: Blue EVA Posting: none Additional orthotic padding: none

## 2016-12-10 DIAGNOSIS — E039 Hypothyroidism, unspecified: Secondary | ICD-10-CM | POA: Diagnosis not present

## 2016-12-12 DIAGNOSIS — E039 Hypothyroidism, unspecified: Secondary | ICD-10-CM | POA: Diagnosis not present

## 2017-02-27 DIAGNOSIS — E039 Hypothyroidism, unspecified: Secondary | ICD-10-CM | POA: Diagnosis not present

## 2017-03-05 DIAGNOSIS — E039 Hypothyroidism, unspecified: Secondary | ICD-10-CM | POA: Diagnosis not present

## 2017-05-02 DIAGNOSIS — Z23 Encounter for immunization: Secondary | ICD-10-CM | POA: Diagnosis not present

## 2017-05-06 DIAGNOSIS — E039 Hypothyroidism, unspecified: Secondary | ICD-10-CM | POA: Diagnosis not present

## 2017-06-30 ENCOUNTER — Ambulatory Visit: Payer: 59 | Admitting: Sports Medicine

## 2017-07-02 ENCOUNTER — Ambulatory Visit: Payer: 59 | Admitting: Sports Medicine

## 2017-07-02 ENCOUNTER — Encounter: Payer: Self-pay | Admitting: Sports Medicine

## 2017-07-02 ENCOUNTER — Ambulatory Visit
Admission: RE | Admit: 2017-07-02 | Discharge: 2017-07-02 | Disposition: A | Discharge status: Home / Self Care | Payer: 59 | Source: Ambulatory Visit | Attending: Sports Medicine | Admitting: Sports Medicine

## 2017-07-02 VITALS — BP 111/73 | Ht 63.0 in | Wt 144.0 lb

## 2017-07-02 DIAGNOSIS — M25511 Pain in right shoulder: Secondary | ICD-10-CM

## 2017-07-02 DIAGNOSIS — M25311 Other instability, right shoulder: Secondary | ICD-10-CM | POA: Diagnosis not present

## 2017-07-02 DIAGNOSIS — M19011 Primary osteoarthritis, right shoulder: Secondary | ICD-10-CM | POA: Diagnosis not present

## 2017-07-02 NOTE — Patient Instructions (Signed)
  It was good to see you today Deborah Bowman.  You have shoulder instability and possibly a labral tear. Let me know if you would like for me to proceed with an MRI arthrogram of your shoulder.  If you decide not to pursue the MRI then we should start some physical therapy.  I will wait to hear back from you.

## 2017-07-02 NOTE — Progress Notes (Addendum)
   Subjective:    Patient ID: Deborah Bowman, female    DOB: 1971-03-14, 46 y.o.   MRN: [id number]  HPI chief complaint: Right shoulder pain  Very pleasant 46 year old right-hand-dominant female comes in today complaining of several months of right shoulder pain. She was seen with the same complaint back in March. She was given home exercises which have not been helpful. Her main complaint is shoulder instability and reoccurring dislocations that primarily occur when the arm is in an abducted and externally rotated position or in a forward flexed position with resistance. When her shoulder "comes out" she has to self reduce it by pushing against an object such as a wall. This immediately relieves her pain. She is starting to get feelings of instability or sleeping at night as well. Her symptoms are very intermittent. She only gets pain when the shoulder feels unstable. This usually occurs 1-2 times a week. She denies any recent or remote trauma. No prior shoulder surgeries. No imaging.  Past medical history reviewed Medications reviewed Allergies reviewed    Review of Systems As above    Objective:   Physical Exam  Well-developed, well-nourished. No acute distress. Awake alert and oriented 3. Vital signs reviewed  Right shoulder: Patient has full active range of motion. Markedly positive apprehension sign. 1-2+ sulcus. Rotator cuff strength is 5/5. Equivocal O'Brien's. No tenderness to palpation. Neurovascularly intact distally.  X-rays of the right shoulder including an AP and axillary view show degenerative changes at the acromioclavicular joint. Otherwise unremarkable.      Assessment & Plan:   Right shoulder pain secondary to shoulder instability with possible labral tear  I had a long discussion with the patient regarding treatment and workup going forward. She would like to think about her options. We discussed getting an MRI arthrogram to rule out a labral tear. Even an  unremarkable MRI would not rule out surgery given her instability. We also discussed the possibility of formal physical therapy. She would like to think about her options and will get back to me. In the meantime, she needs to avoid exercises in the gym that put her arm in an abducted and externally rotated position. She should avoid repetitive overhead lifting as well.  Addendum: Patient was like to proceed with MRI arthrogram. We will schedule this and I will follow-up with her via telephone with those results when available.

## 2017-07-03 NOTE — Addendum Note (Signed)
Addended by: Annita BrodMOORE, Alayshia Marini C on: 07/03/2017 08:21 AM   Modules accepted: Orders

## 2017-07-07 DIAGNOSIS — E039 Hypothyroidism, unspecified: Secondary | ICD-10-CM | POA: Diagnosis not present

## 2017-07-21 ENCOUNTER — Ambulatory Visit
Admission: RE | Admit: 2017-07-21 | Discharge: 2017-07-21 | Disposition: A | Discharge status: Home / Self Care | Payer: 59 | Source: Ambulatory Visit | Attending: Sports Medicine | Admitting: Sports Medicine

## 2017-07-21 DIAGNOSIS — M25311 Other instability, right shoulder: Secondary | ICD-10-CM

## 2017-07-21 DIAGNOSIS — M25511 Pain in right shoulder: Secondary | ICD-10-CM

## 2017-07-21 DIAGNOSIS — S43431A Superior glenoid labrum lesion of right shoulder, initial encounter: Secondary | ICD-10-CM | POA: Diagnosis not present

## 2017-07-21 MED ORDER — IOPAMIDOL (ISOVUE-M 200) INJECTION 41%
15.0000 mL | Freq: Once | INTRAMUSCULAR | Status: AC
  Administered 2017-07-21: 15 mL via INTRA_ARTICULAR

## 2017-07-23 ENCOUNTER — Telehealth: Payer: Self-pay | Admitting: Sports Medicine

## 2017-07-23 NOTE — Telephone Encounter (Signed)
Opened by accident

## 2017-07-23 NOTE — Telephone Encounter (Signed)
  I spoke with the patient on the phone today after reviewing the MRI arthrogram of her right shoulder. 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 She has an extensive superior labral tear extending to the biceps anchor but not involving the biceps tendon. She also has a fairly extensive anterior inferior labral tear from the 2:00 to 5:00 position. 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 There is also a loose body noted which appears to be a bony fragment. Based on this constellation of findings, I recommend consultation with Dr. Dion SaucierLandau to discuss shoulder reconstruction. Patient is in agreement with that plan. Further treatment will be per the discretion of Dr. Dion SaucierLandau. Follow-up with me as needed.

## 2017-07-24 NOTE — Telephone Encounter (Signed)
[facility] [address] Monday January 7th 2019 at 1245p Arrival time is 1230p Phone: [phone]

## 2017-07-25 DIAGNOSIS — S43014A Anterior dislocation of right humerus, initial encounter: Secondary | ICD-10-CM | POA: Diagnosis not present

## 2017-09-04 DIAGNOSIS — G8918 Other acute postprocedural pain: Secondary | ICD-10-CM | POA: Diagnosis not present

## 2017-09-04 DIAGNOSIS — M24111 Other articular cartilage disorders, right shoulder: Secondary | ICD-10-CM | POA: Diagnosis not present

## 2017-09-04 DIAGNOSIS — S43004A Unspecified dislocation of right shoulder joint, initial encounter: Secondary | ICD-10-CM | POA: Diagnosis not present

## 2017-09-04 DIAGNOSIS — M25311 Other instability, right shoulder: Secondary | ICD-10-CM | POA: Diagnosis not present

## 2017-09-04 DIAGNOSIS — S43431A Superior glenoid labrum lesion of right shoulder, initial encounter: Secondary | ICD-10-CM | POA: Diagnosis not present

## 2017-09-04 DIAGNOSIS — S43491D Other sprain of right shoulder joint, subsequent encounter: Secondary | ICD-10-CM | POA: Diagnosis not present

## 2017-09-30 DIAGNOSIS — E039 Hypothyroidism, unspecified: Secondary | ICD-10-CM | POA: Diagnosis not present

## 2017-09-30 DIAGNOSIS — E7800 Pure hypercholesterolemia, unspecified: Secondary | ICD-10-CM | POA: Diagnosis not present

## 2017-10-23 DIAGNOSIS — M6281 Muscle weakness (generalized): Secondary | ICD-10-CM | POA: Diagnosis not present

## 2017-10-23 DIAGNOSIS — M25611 Stiffness of right shoulder, not elsewhere classified: Secondary | ICD-10-CM | POA: Diagnosis not present

## 2017-10-23 DIAGNOSIS — M25511 Pain in right shoulder: Secondary | ICD-10-CM | POA: Diagnosis not present

## 2017-10-28 DIAGNOSIS — M25611 Stiffness of right shoulder, not elsewhere classified: Secondary | ICD-10-CM | POA: Diagnosis not present

## 2017-10-28 DIAGNOSIS — M25511 Pain in right shoulder: Secondary | ICD-10-CM | POA: Diagnosis not present

## 2017-10-28 DIAGNOSIS — M6281 Muscle weakness (generalized): Secondary | ICD-10-CM | POA: Diagnosis not present

## 2017-11-04 DIAGNOSIS — M6281 Muscle weakness (generalized): Secondary | ICD-10-CM | POA: Diagnosis not present

## 2017-11-04 DIAGNOSIS — M25611 Stiffness of right shoulder, not elsewhere classified: Secondary | ICD-10-CM | POA: Diagnosis not present

## 2017-11-04 DIAGNOSIS — M25511 Pain in right shoulder: Secondary | ICD-10-CM | POA: Diagnosis not present

## 2017-11-06 DIAGNOSIS — M25511 Pain in right shoulder: Secondary | ICD-10-CM | POA: Diagnosis not present

## 2017-11-06 DIAGNOSIS — M6281 Muscle weakness (generalized): Secondary | ICD-10-CM | POA: Diagnosis not present

## 2017-11-06 DIAGNOSIS — M25611 Stiffness of right shoulder, not elsewhere classified: Secondary | ICD-10-CM | POA: Diagnosis not present

## 2017-11-11 DIAGNOSIS — M6281 Muscle weakness (generalized): Secondary | ICD-10-CM | POA: Diagnosis not present

## 2017-11-11 DIAGNOSIS — M25611 Stiffness of right shoulder, not elsewhere classified: Secondary | ICD-10-CM | POA: Diagnosis not present

## 2017-11-11 DIAGNOSIS — M25511 Pain in right shoulder: Secondary | ICD-10-CM | POA: Diagnosis not present

## 2017-11-13 DIAGNOSIS — M25511 Pain in right shoulder: Secondary | ICD-10-CM | POA: Diagnosis not present

## 2017-11-13 DIAGNOSIS — M25611 Stiffness of right shoulder, not elsewhere classified: Secondary | ICD-10-CM | POA: Diagnosis not present

## 2017-11-13 DIAGNOSIS — M6281 Muscle weakness (generalized): Secondary | ICD-10-CM | POA: Diagnosis not present

## 2018-08-27 DIAGNOSIS — Z01419 Encounter for gynecological examination (general) (routine) without abnormal findings: Secondary | ICD-10-CM | POA: Diagnosis not present

## 2018-08-27 DIAGNOSIS — Z1231 Encounter for screening mammogram for malignant neoplasm of breast: Secondary | ICD-10-CM | POA: Diagnosis not present

## 2018-08-27 DIAGNOSIS — Z6825 Body mass index (BMI) 25.0-25.9, adult: Secondary | ICD-10-CM | POA: Diagnosis not present

## 2018-08-31 ENCOUNTER — Other Ambulatory Visit: Payer: Self-pay | Admitting: Obstetrics and Gynecology

## 2018-08-31 DIAGNOSIS — R928 Other abnormal and inconclusive findings on diagnostic imaging of breast: Secondary | ICD-10-CM

## 2018-09-02 ENCOUNTER — Ambulatory Visit
Admission: RE | Admit: 2018-09-02 | Discharge: 2018-09-02 | Disposition: A | Discharge status: Home / Self Care | Payer: 59 | Source: Ambulatory Visit | Attending: Obstetrics and Gynecology | Admitting: Obstetrics and Gynecology

## 2018-09-02 ENCOUNTER — Ambulatory Visit
Admission: RE | Admit: 2018-09-02 | Discharge: 2018-09-02 | Disposition: A | Discharge status: Home / Self Care | Payer: Commercial Managed Care - PPO | Source: Ambulatory Visit | Attending: Obstetrics and Gynecology | Admitting: Obstetrics and Gynecology

## 2018-09-02 DIAGNOSIS — R922 Inconclusive mammogram: Secondary | ICD-10-CM | POA: Diagnosis not present

## 2018-09-02 DIAGNOSIS — R928 Other abnormal and inconclusive findings on diagnostic imaging of breast: Secondary | ICD-10-CM

## 2018-09-02 DIAGNOSIS — N6489 Other specified disorders of breast: Secondary | ICD-10-CM | POA: Diagnosis not present

## 2018-09-15 DIAGNOSIS — E7800 Pure hypercholesterolemia, unspecified: Secondary | ICD-10-CM | POA: Diagnosis not present

## 2018-09-15 DIAGNOSIS — E039 Hypothyroidism, unspecified: Secondary | ICD-10-CM | POA: Diagnosis not present

## 2018-09-15 DIAGNOSIS — Z0000 Encounter for general adult medical examination without abnormal findings: Secondary | ICD-10-CM | POA: Diagnosis not present

## 2019-07-12 ENCOUNTER — Other Ambulatory Visit: Payer: Commercial Managed Care - PPO

## 2019-09-01 ENCOUNTER — Other Ambulatory Visit: Payer: Commercial Managed Care - PPO

## 2019-09-03 ENCOUNTER — Ambulatory Visit: Payer: Commercial Managed Care - PPO | Attending: Internal Medicine

## 2019-09-03 DIAGNOSIS — Z20822 Contact with and (suspected) exposure to covid-19: Secondary | ICD-10-CM

## 2019-09-05 ENCOUNTER — Telehealth: Payer: Self-pay

## 2019-09-05 LAB — NOVEL CORONAVIRUS, NAA: SARS-CoV-2, NAA: NOT DETECTED

## 2019-09-05 NOTE — Telephone Encounter (Signed)
Pt called for covid results- advised pt that results are not back.  

## 2019-09-30 ENCOUNTER — Ambulatory Visit: Payer: Commercial Managed Care - PPO | Attending: Internal Medicine

## 2019-09-30 DIAGNOSIS — Z23 Encounter for immunization: Secondary | ICD-10-CM

## 2019-09-30 NOTE — Progress Notes (Signed)
   Covid-19 Vaccination Clinic  Name:  Deborah Bowman    MRN: [id number] DOB: 1970/12/21  09/30/2019  Deborah Bowman was observed post Covid-19 immunization for 15 minutes without incident. She was provided with Vaccine Information Sheet and instruction to access the V-Safe system.   Deborah Bowman was instructed to call 911 with any severe reactions post vaccine: Marland Kitchen Difficulty breathing  . Swelling of face and throat  . A fast heartbeat  . A bad rash all over body  . Dizziness and weakness   Immunizations Administered    Name Date Dose VIS Date Route   Pfizer COVID-19 Vaccine 09/30/2019  1:40 PM 0.3 mL 07/02/2019 Intramuscular   Manufacturer: [employer], [employer]   Lot: [id number]   NDC: [address]-8

## 2019-10-26 ENCOUNTER — Ambulatory Visit: Payer: Commercial Managed Care - PPO | Attending: Internal Medicine

## 2019-10-26 DIAGNOSIS — Z23 Encounter for immunization: Secondary | ICD-10-CM

## 2019-10-26 NOTE — Progress Notes (Signed)
   Covid-19 Vaccination Clinic  Name:  Deborah Bowman    MRN: [id number] DOB: August 10, 1970  10/26/2019  [name] was observed post Covid-19 immunization for 15 minutes without incident. She was provided with Vaccine Information Sheet and instruction to access the V-Safe system.   [name] was instructed to call 911 with any severe reactions post vaccine: Marland Kitchen Difficulty breathing  . Swelling of face and throat  . A fast heartbeat  . A bad rash all over body  . Dizziness and weakness   Immunizations Administered    Name Date Dose VIS Date Route   Pfizer COVID-19 Vaccine 10/26/2019  1:27 PM 0.3 mL 07/02/2019 Intramuscular   Manufacturer: [employer], [employer]   Lot: [id number]   NDC: [address]-1

## 2020-11-13 ENCOUNTER — Other Ambulatory Visit: Payer: Self-pay

## 2020-11-13 ENCOUNTER — Ambulatory Visit: Payer: Commercial Managed Care - PPO | Admitting: Family Medicine

## 2020-11-13 ENCOUNTER — Ambulatory Visit: Payer: Self-pay

## 2020-11-13 VITALS — BP 100/72 | Ht 63.0 in | Wt 145.0 lb

## 2020-11-13 DIAGNOSIS — M79662 Pain in left lower leg: Secondary | ICD-10-CM | POA: Diagnosis not present

## 2020-11-13 NOTE — Patient Instructions (Signed)
You have a low grade 2 calf strain Compression sleeve or ace wrap to help with swelling and pain if tolerated. Icing for 15 minutes at a time 3-4 times a day Heel lifts or shoes with a natural heel lift to prevent further strain. Crutches if needed Tylenol and/or aleve for pain as needed. Start ankle range of motion exercises twice a day (up/down and alphabet exercises). Follow up with me in 2 weeks.  The running article association with osteoarthritis is from the Journal of Orthopaedic and Sports Physical Therapy entitled 'The Association of Recreational and Competitive Running with Hip and Knee Osteoarthritis: A Systematic Review and Meta-analysis' showing recreational runners had the lowest risk of arthritis though there are a lot of other articles from the past 5 years showing similar findings.  Focus on IT band stretching (I'd recommend doing this for both sides). Bring your orthotics with you when you come back in 2 weeks.

## 2020-11-14 ENCOUNTER — Encounter: Payer: Self-pay | Admitting: Family Medicine

## 2020-11-14 NOTE — Progress Notes (Signed)
PCP: Lewis Moccasin, MD  Subjective:   HPI: Patient is a 50 y.o. female here for left calf injury.  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 Questionable swelling. No bruising. No prior injury. She is active and completed a marathon on 3/17.  Past Medical History:  Diagnosis Date  . AMA (advanced maternal age) multigravida 35+   . Hx of varicella   . Hypothyroidism     Current Outpatient Medications on File Prior to Visit  Medication Sig Dispense Refill  . levothyroxine (SYNTHROID, LEVOTHROID) 100 MCG tablet   1   No current facility-administered medications on file prior to visit.    Past Surgical History:  Procedure Laterality Date  . BUNIONECTOMY  1996  . CESAREAN SECTION N/A 12/25/2012   Procedure: CESAREAN SECTION;  Surgeon: Zelphia Cairo, MD;  Location: [facility];  Service: Obstetrics;  Laterality: N/A;  Primary Cesarean Section Delivery Baby Boy @ [phone], Apgars 8/9  . CESAREAN SECTION WITH BILATERAL TUBAL LIGATION Right 12/13/2014   Procedure: REPEAT CESAREAN SECTION WITH RIGHT PARTIAL SALPINGECTOMY;  Surgeon: Zelphia Cairo, MD;  Location: [facility];  Service: Obstetrics;  Laterality: Right;  . DILATION AND CURETTAGE OF UTERUS    . paraovarian cyst removal     and tube ? side  . WISDOM TOOTH EXTRACTION      No Known Allergies  Social History   Socioeconomic History  . Marital status: Married    Spouse name: Not on file  . Number of children: Not on file  . Years of education: Not on file  . Highest education level: Not on file  Occupational History  . Not on file  Tobacco Use  . Smoking status: Never Smoker  . Smokeless tobacco: Never Used  Substance and Sexual Activity  . Alcohol use: No  . Drug use: No  . Sexual activity: Yes  Other Topics Concern  . Not  on file  Social History Narrative  . Not on file   Social Determinants of Health   Financial Resource Strain: Not on file  Food Insecurity: Not on file  Transportation Needs: Not on file  Physical Activity: Not on file  Stress: Not on file  Social Connections: Not on file  Intimate Partner Violence: Not on file    Family History  Problem Relation Age of Onset  . Hypothyroidism Father   . Heart disease Maternal Grandmother   . Hypertension Maternal Grandmother   . Depression Paternal Grandmother   . Heart disease Maternal Grandfather     BP 100/72   Ht 5\' 3"  (1.6 m)   Wt 145 lb (65.8 kg)   BMI 25.69 kg/m   [facility] 11/13/2020  Frequency of aerobic exercise (# of days/week) 4  Average time in minutes 40  Frequency of strengthening activities (# of days/week) 0    No flowsheet data found.  Review of Systems: See HPI above.     Objective:  Physical Exam:  Gen: NAD, comfortable in exam room  Left lower leg/ankle: No gross deformity, swelling, ecchymoses FROM ankle with pain on full dorsiflexion.  Strength 5/5 all motions TTP medial gastroc.  No other tenderness negative ant drawer and negative talar tilt.   Negative syndesmotic compression. Thompsons test negative. NV intact distally.  MSK u/s  left lower leg:  No obvious gastroc tear visualized.  Does have only a small amount of fluid distal/deep to medial gastroc near musculotendinous junction.   Assessment & Plan:  1. Left calf strain - consistent with low grade 2 strain.  Ultrasound reassuring.  Prognostically expect 3-4 weeks for recovery.  Compression, icing, heel lifts.  Motion exercises and will advance to strengthening at follow-up appointment.  Tylenol and/or aleve.  She also noted some lateral and posterior hip tightness - on brief exam noted + ober's and tenderness proximal IT band but negative faber, fadir, logroll.  Shown IT band stretches.  Glut med strength 5/5.

## 2020-11-27 ENCOUNTER — Other Ambulatory Visit: Payer: Self-pay

## 2020-11-27 ENCOUNTER — Encounter: Payer: Self-pay | Admitting: Family Medicine

## 2020-11-27 ENCOUNTER — Ambulatory Visit: Payer: Commercial Managed Care - PPO | Admitting: Family Medicine

## 2020-11-27 VITALS — BP 112/78 | Ht 63.0 in | Wt 144.0 lb

## 2020-11-27 DIAGNOSIS — M79662 Pain in left lower leg: Secondary | ICD-10-CM | POA: Diagnosis not present

## 2020-11-27 NOTE — Progress Notes (Signed)
PCP: Lewis Moccasin, MD  Subjective:   HPI: Patient is a 50 y.o. female here for left calf injury.  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 Questionable swelling. No bruising. No prior injury. She is active and completed a marathon on 3/17.  5/9: Patient reports she's doing very well. Used crutches that day and the next but otherwise able to ambulate much better. Used compression until a couple days ago. Has not tried running yet. Able to do calf raise without pain.  Past Medical History:  Diagnosis Date  . AMA (advanced maternal age) multigravida 35+   . Hx of varicella   . Hypothyroidism     Current Outpatient Medications on File Prior to Visit  Medication Sig Dispense Refill  . levothyroxine (SYNTHROID, LEVOTHROID) 100 MCG tablet   1   No current facility-administered medications on file prior to visit.    Past Surgical History:  Procedure Laterality Date  . BUNIONECTOMY  1996  . CESAREAN SECTION N/A 12/25/2012   Procedure: CESAREAN SECTION;  Surgeon: Zelphia Cairo, MD;  Location: [facility];  Service: Obstetrics;  Laterality: N/A;  Primary Cesarean Section Delivery Baby Boy @ [phone], Apgars 8/9  . CESAREAN SECTION WITH BILATERAL TUBAL LIGATION Right 12/13/2014   Procedure: REPEAT CESAREAN SECTION WITH RIGHT PARTIAL SALPINGECTOMY;  Surgeon: Zelphia Cairo, MD;  Location: [facility];  Service: Obstetrics;  Laterality: Right;  . DILATION AND CURETTAGE OF UTERUS    . paraovarian cyst removal     and tube ? side  . WISDOM TOOTH EXTRACTION      No Known Allergies  Social History   Socioeconomic History  . Marital status: Married    Spouse name: Not on file  . Number of children: Not on file  . Years of education: Not on file  . Highest education level: Not on file   Occupational History  . Not on file  Tobacco Use  . Smoking status: Never Smoker  . Smokeless tobacco: Never Used  Substance and Sexual Activity  . Alcohol use: No  . Drug use: No  . Sexual activity: Yes  Other Topics Concern  . Not on file  Social History Narrative  . Not on file   Social Determinants of Health   Financial Resource Strain: Not on file  Food Insecurity: Not on file  Transportation Needs: Not on file  Physical Activity: Not on file  Stress: Not on file  Social Connections: Not on file  Intimate Partner Violence: Not on file    Family History  Problem Relation Age of Onset  . Hypothyroidism Father   . Heart disease Maternal Grandmother   . Hypertension Maternal Grandmother   . Depression Paternal Grandmother   . Heart disease Maternal Grandfather     BP 112/78   Ht 5\' 3"  (1.6 m)   Wt 144 lb (65.3 kg)   BMI 25.51 kg/m   [facility] 11/13/2020 11/27/2020  Frequency of aerobic exercise (# of days/week) 4 4  Average time in minutes 40 40  Frequency of strengthening activities (# of days/week) 0 0    No flowsheet data found.  Review of Systems: See HPI above.     Objective:  Physical Exam:  Gen: NAD, comfortable in exam room  Left lower  leg/ankle: No gross deformity, swelling, ecchymoses FROM with normal strength, no pain.  Single calf raise without pain. Minimal TTP medial gastroc. Negative ant drawer and negative talar tilt.   Thompsons test negative. NV intact distally.   Assessment & Plan:  1. Left calf strain - 2/2 low grade 2 strain.  Much improved.  Start strengthening exercises.  Ok to run now at lower pace - start level surface 1 mile and not run on back to back days.  Compression if needed, icing if needed.  F/u prn.  Looked at her custom orthotics and they're both cracked at mid-forefoot junction.

## 2020-11-27 NOTE — Patient Instructions (Signed)
You're doing great! Ok to start running now - I'd start with 1 mile on level surface (track or treadmill ideal) and not run on back to back days for the next week. Increase by half mile each time. You should be able to go back to normal running without restrictions the following week. Do home exercises most days of the week for your calf for the next 2-4 weeks. Compression only if needed at this point. Make appointment at your convenience for custom orthotics as yours are cracked through the mid-forefoot. Continue with the stretches for your hip also.  Deborah Bowman does the [employer] at [employer] in [place] - the 16 class package is 340 but you can come for a free class to see if your son would like it first which is nice.  At that location it's Monday (4pm), Tuesday (4pm), or Saturday (9am).

## 2020-11-29 ENCOUNTER — Ambulatory Visit (INDEPENDENT_AMBULATORY_CARE_PROVIDER_SITE_OTHER): Payer: Commercial Managed Care - PPO | Admitting: Family Medicine

## 2020-11-29 ENCOUNTER — Encounter: Payer: Self-pay | Admitting: Family Medicine

## 2020-11-29 ENCOUNTER — Other Ambulatory Visit: Payer: Self-pay

## 2020-11-29 VITALS — BP 108/68 | Ht 63.0 in | Wt 144.0 lb

## 2020-11-29 DIAGNOSIS — R269 Unspecified abnormalities of gait and mobility: Secondary | ICD-10-CM | POA: Diagnosis not present

## 2020-11-29 NOTE — Progress Notes (Signed)
PCP: Lewis Moccasin, MD  Subjective:   HPI: Patient is a 50 y.o. female here for new custom orthotics.  At last visit noted patient's orthotics have cracked at mid-forefoot junction so here for new orthotics. No current foot pain - reports initially had medial ankle pain localized to post tib that led to orthotics.  Past Medical History:  Diagnosis Date  . AMA (advanced maternal age) multigravida 35+   . Hx of varicella   . Hypothyroidism     Current Outpatient Medications on File Prior to Visit  Medication Sig Dispense Refill  . levothyroxine (SYNTHROID, LEVOTHROID) 100 MCG tablet   1   No current facility-administered medications on file prior to visit.    Past Surgical History:  Procedure Laterality Date  . BUNIONECTOMY  1996  . CESAREAN SECTION N/A 12/25/2012   Procedure: CESAREAN SECTION;  Surgeon: Zelphia Cairo, MD;  Location: [facility];  Service: Obstetrics;  Laterality: N/A;  Primary Cesarean Section Delivery Baby Boy @ [phone], Apgars 8/9  . CESAREAN SECTION WITH BILATERAL TUBAL LIGATION Right 12/13/2014   Procedure: REPEAT CESAREAN SECTION WITH RIGHT PARTIAL SALPINGECTOMY;  Surgeon: Zelphia Cairo, MD;  Location: [facility];  Service: Obstetrics;  Laterality: Right;  . DILATION AND CURETTAGE OF UTERUS    . paraovarian cyst removal     and tube ? side  . WISDOM TOOTH EXTRACTION      No Known Allergies  Social History   Socioeconomic History  . Marital status: Married    Spouse name: Not on file  . Number of children: Not on file  . Years of education: Not on file  . Highest education level: Not on file  Occupational History  . Not on file  Tobacco Use  . Smoking status: Never Smoker  . Smokeless tobacco: Never Used  Substance and Sexual Activity  . Alcohol use: No  . Drug use: No  . Sexual activity: Yes  Other Topics Concern  . Not on file  Social History Narrative  . Not on file   Social Determinants of Health   Financial Resource Strain: Not on file   Food Insecurity: Not on file  Transportation Needs: Not on file  Physical Activity: Not on file  Stress: Not on file  Social Connections: Not on file  Intimate Partner Violence: Not on file    Family History  Problem Relation Age of Onset  . Hypothyroidism Father   . Heart disease Maternal Grandmother   . Hypertension Maternal Grandmother   . Depression Paternal Grandmother   . Heart disease Maternal Grandfather     BP 108/68   Ht 5\' 3"  (1.6 m)   Wt 144 lb (65.3 kg)   BMI 25.51 kg/m   [facility] 11/13/2020 11/27/2020  Frequency of aerobic exercise (# of days/week) 4 4  Average time in minutes 40 40  Frequency of strengthening activities (# of days/week) 0 0    No flowsheet data found.  Review of Systems: See HPI above.     Objective:  Physical Exam:  Gen: NAD, comfortable in exam room  No leg length discrepancy.  Gait normal with old orthotics in her shoes, well controlled pronation.  Bilateral foot/ankle: Mod overpronation.  Hallux valgus.  No other gross deformity, swelling, ecchymoses FROM ankle, 1st MTP. No TTP negative ant drawer and negative talar tilt.   Negative syndesmotic compression. Thompsons test negative. NV intact distally.   Assessment & Plan:  1. Gait abnormality with pes planus - new  custom orthotics made today as noted below.  Felt comfortable to her.  F/u prn for these.  Patient was fitted for a : standard, cushioned, semi-rigid orthotic. The orthotic was heated and afterward the patient stood on the orthotic blank positioned on the orthotic stand. The patient was positioned in subtalar neutral position and 10 degrees of ankle dorsiflexion in a weight bearing stance. After completion of molding, a stable base was applied to the orthotic blank. The blank was ground to a stable position for weight bearing. Size: 6  Base: blue med density eva Posting: none Additional orthotic padding: none

## 2021-01-14 IMAGING — MG DIGITAL DIAGNOSTIC UNILATERAL RIGHT MAMMOGRAM WITH TOMO AND CAD
4 series · 4 of 12 positions shown · non-contrast
Comparison: 08/27/2018

CLINICAL DATA: The patient returns after screening for evaluation
of a possible RIGHT breast mass. The patient reports some weight
loss since 3672.

EXAM:
DIGITAL DIAGNOSTIC RIGHT MAMMOGRAM WITH CAD
ULTRASOUND of the BREAST

[R MLO synth-2D]
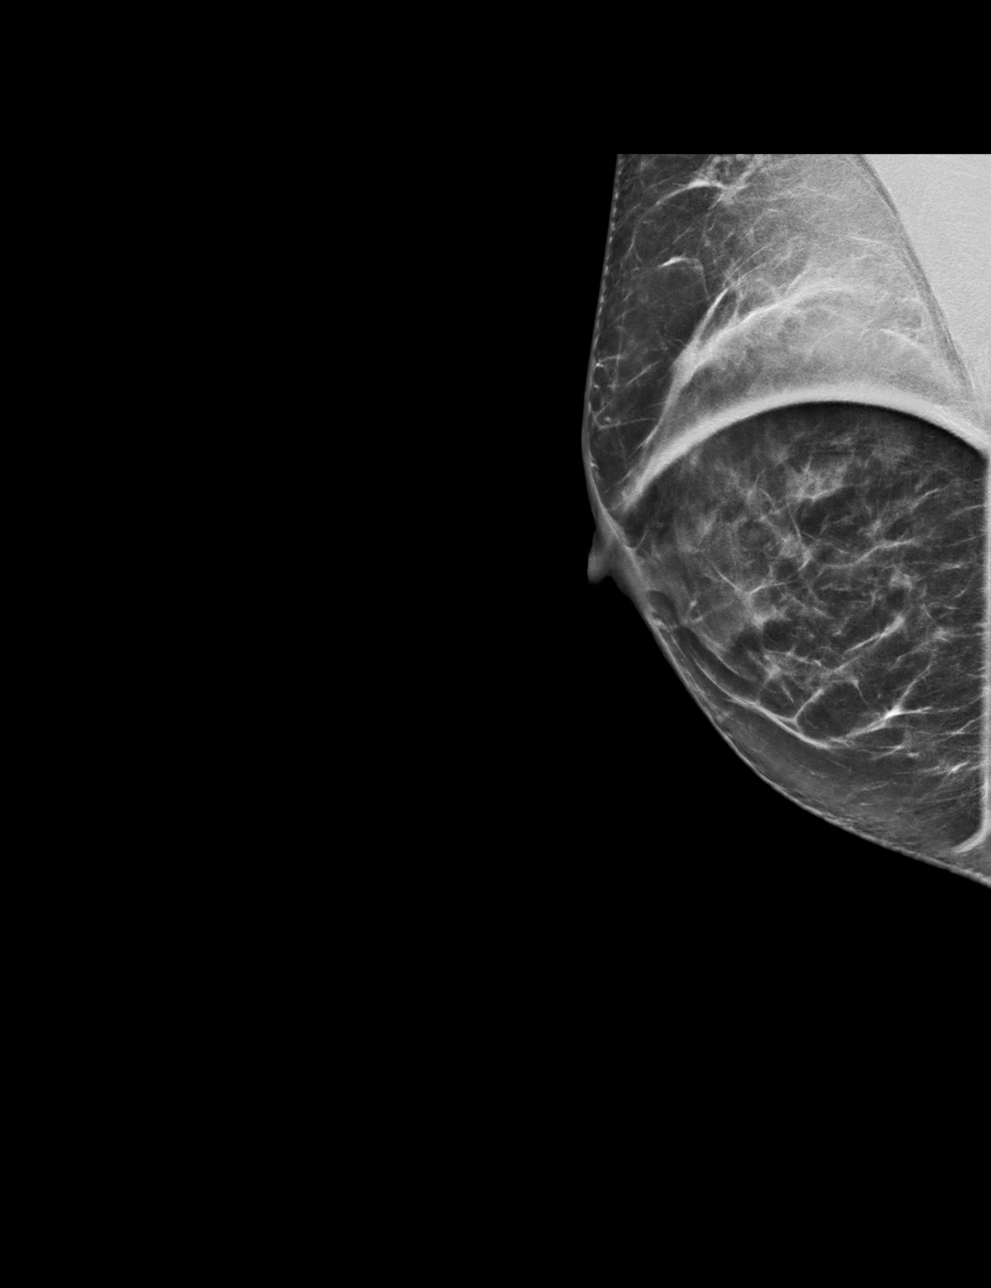

[R CC synth-2D]
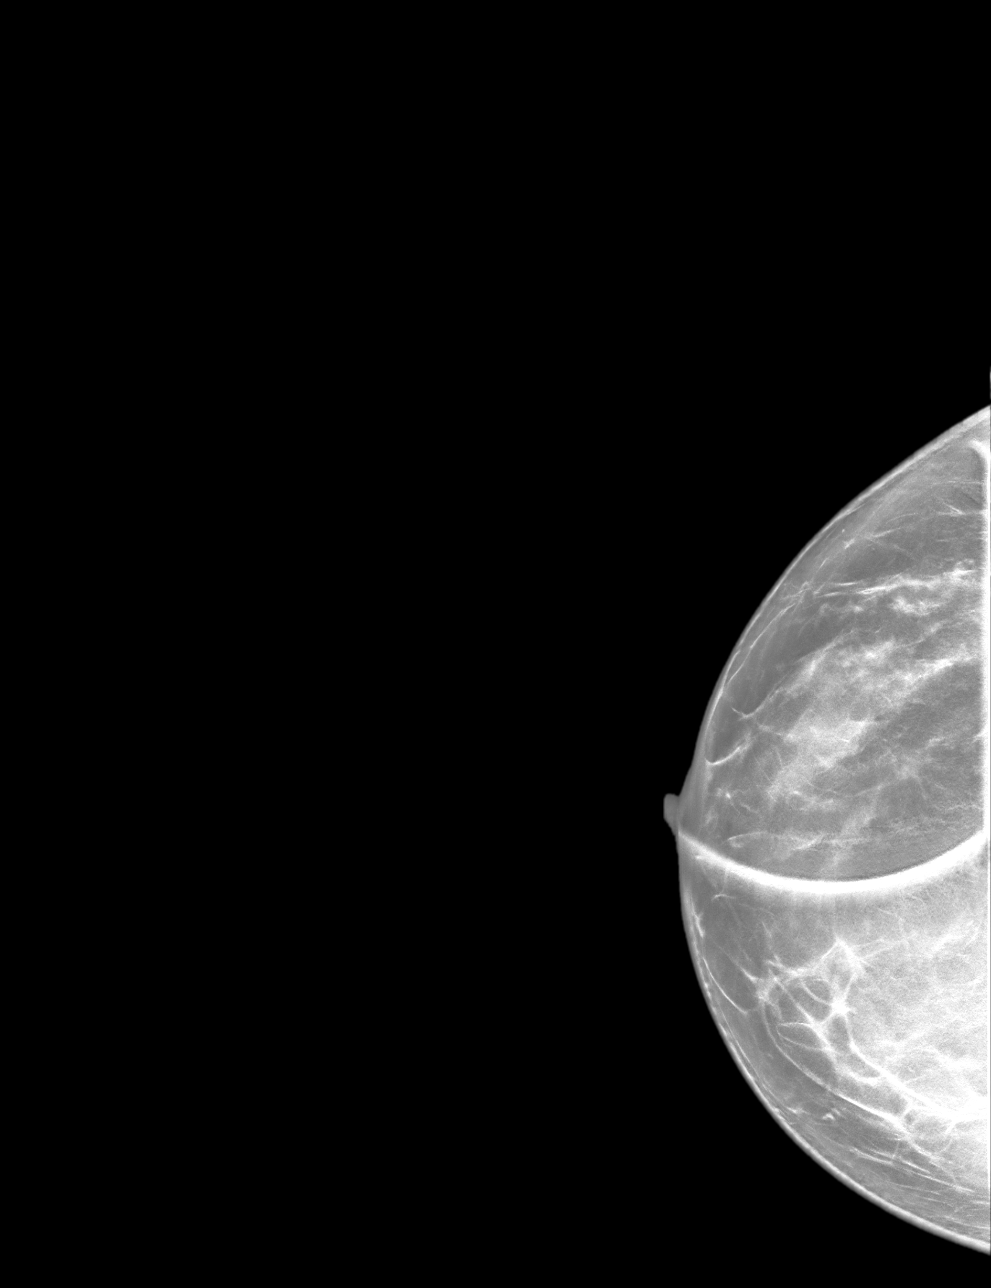

[R MLO tomo · tomo slice 32/63.0]
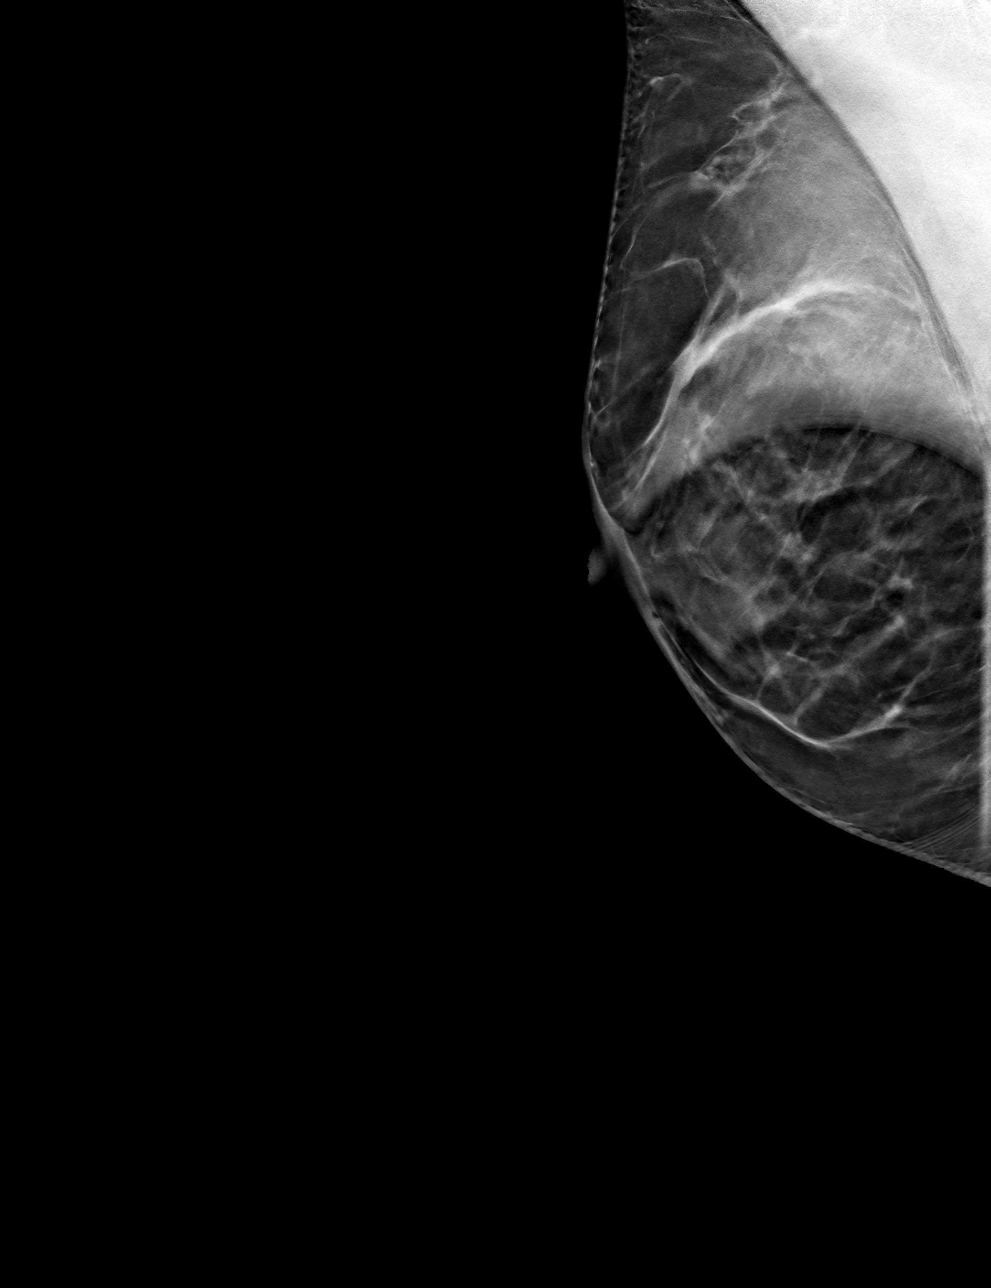

[R CC tomo · tomo slice 32/63.0]
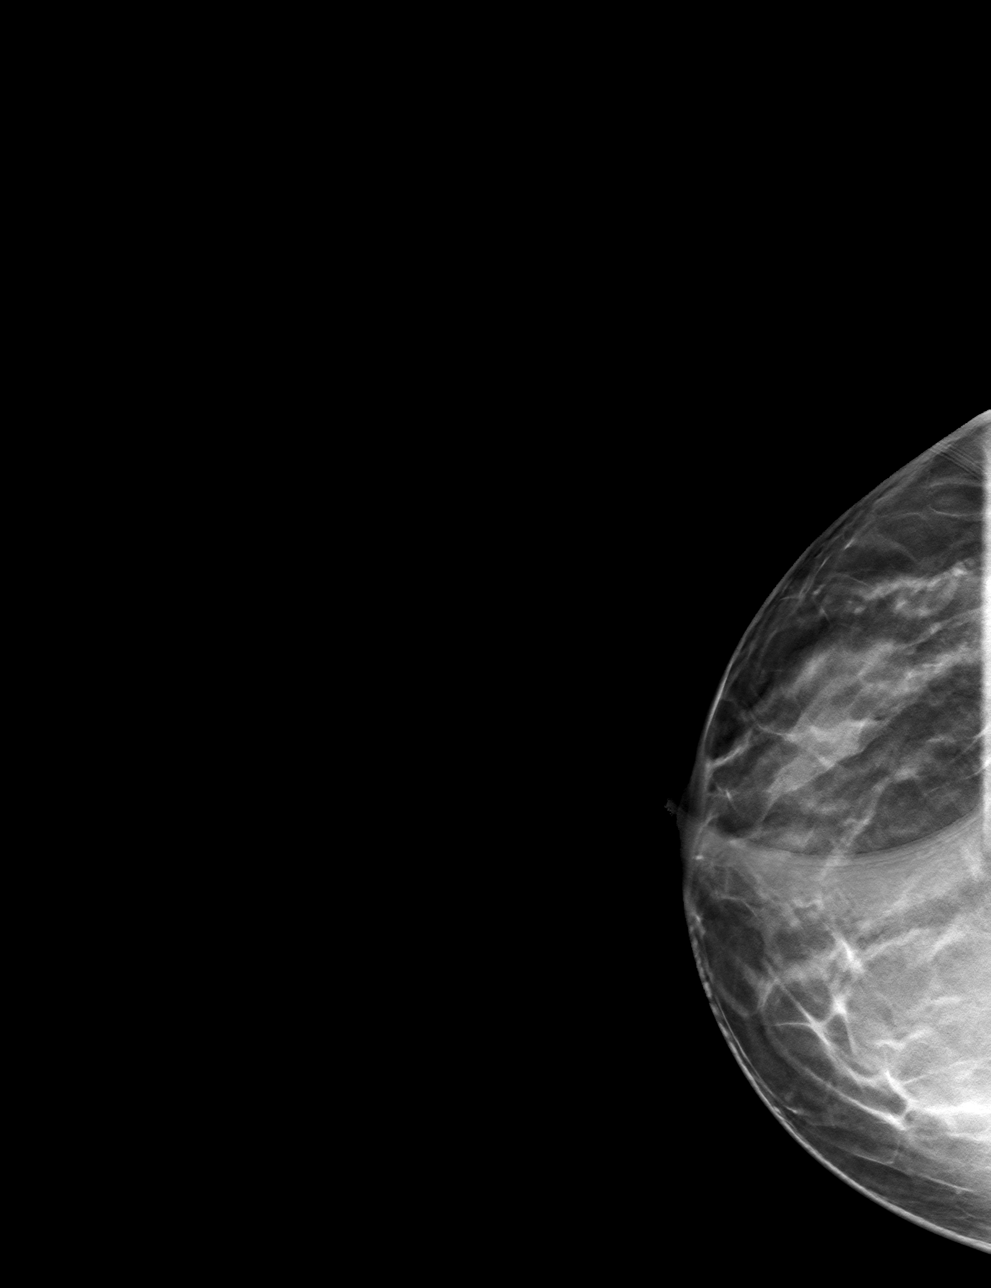

[4 of 12 positions shown; findings below may reference images not displayed]

ACR Breast Density Category c: The breast tissue is heterogeneously
dense, which may obscure small masses.
FINDINGS: Additional 2-D and 3-D images are performed. These views show no
persistent mass or distortion in the LOWER OUTER QUADRANT of the
RIGHT breast.

Mammographic images were processed with CAD.

On physical exam, I palpate no abnormality in the LOWER OUTER
QUADRANT of the RIGHT breast.

Targeted ultrasound is performed, showing normal appearing
fibroglandular tissue in the LOWER OUTER QUADRANT of the RIGHT
breast. No suspicious mass, distortion, or acoustic shadowing is
demonstrated with ultrasound.
IMPRESSION: No mammographic or ultrasound evidence for malignancy.

RECOMMENDATION:
Screening mammogram in one year.(Code:[id number])

I have discussed the findings and recommendations with the patient.
Results were also provided in writing at the conclusion of the
visit. If applicable, a reminder letter will be sent to the patient
regarding the next appointment.

BI-RADS CATEGORY  1: Negative.

## 2021-12-06 ENCOUNTER — Other Ambulatory Visit: Payer: Self-pay | Admitting: Internal Medicine

## 2021-12-06 DIAGNOSIS — R072 Precordial pain: Secondary | ICD-10-CM

## 2022-01-09 ENCOUNTER — Ambulatory Visit
Admission: RE | Admit: 2022-01-09 | Discharge: 2022-01-09 | Disposition: A | Discharge status: Home / Self Care | Payer: Commercial Managed Care - PPO | Source: Ambulatory Visit | Attending: Internal Medicine | Admitting: Internal Medicine

## 2022-01-09 DIAGNOSIS — R072 Precordial pain: Secondary | ICD-10-CM

## 2022-01-09 MED ORDER — IOPAMIDOL (ISOVUE-370) INJECTION 76%
75.0000 mL | Freq: Once | INTRAVENOUS | Status: AC | PRN
  Administered 2022-01-09: 75 mL via INTRAVENOUS

## 2023-02-12 DIAGNOSIS — H5213 Myopia, bilateral: Secondary | ICD-10-CM | POA: Diagnosis not present

## 2023-04-07 DIAGNOSIS — E039 Hypothyroidism, unspecified: Secondary | ICD-10-CM | POA: Diagnosis not present

## 2023-04-21 DIAGNOSIS — Z23 Encounter for immunization: Secondary | ICD-10-CM | POA: Diagnosis not present

## 2023-04-21 DIAGNOSIS — E559 Vitamin D deficiency, unspecified: Secondary | ICD-10-CM | POA: Diagnosis not present

## 2023-04-21 DIAGNOSIS — E039 Hypothyroidism, unspecified: Secondary | ICD-10-CM | POA: Diagnosis not present

## 2023-11-11 DIAGNOSIS — Z13220 Encounter for screening for lipoid disorders: Secondary | ICD-10-CM | POA: Diagnosis not present

## 2023-11-11 DIAGNOSIS — D125 Benign neoplasm of sigmoid colon: Secondary | ICD-10-CM | POA: Diagnosis not present

## 2023-11-11 DIAGNOSIS — D509 Iron deficiency anemia, unspecified: Secondary | ICD-10-CM | POA: Diagnosis not present

## 2023-11-11 DIAGNOSIS — J301 Allergic rhinitis due to pollen: Secondary | ICD-10-CM | POA: Diagnosis not present

## 2023-11-11 DIAGNOSIS — E559 Vitamin D deficiency, unspecified: Secondary | ICD-10-CM | POA: Diagnosis not present

## 2023-11-11 DIAGNOSIS — Z0000 Encounter for general adult medical examination without abnormal findings: Secondary | ICD-10-CM | POA: Diagnosis not present

## 2023-11-11 DIAGNOSIS — E039 Hypothyroidism, unspecified: Secondary | ICD-10-CM | POA: Diagnosis not present

## 2023-12-26 DIAGNOSIS — Z1231 Encounter for screening mammogram for malignant neoplasm of breast: Secondary | ICD-10-CM | POA: Diagnosis not present
